# Patient Record
Sex: Female | Born: 1950 | Race: Black or African American | Hispanic: No | Marital: Married | Smoking: Never smoker
Health system: Southern US, Community
[De-identification: ages and names within clinical notes are randomized; demographics above are authoritative.]

## PROBLEM LIST (undated history)

## (undated) DIAGNOSIS — M1990 Unspecified osteoarthritis, unspecified site: Secondary | ICD-10-CM

## (undated) DIAGNOSIS — C801 Malignant (primary) neoplasm, unspecified: Secondary | ICD-10-CM

## (undated) DIAGNOSIS — R51 Headache: Secondary | ICD-10-CM

## (undated) DIAGNOSIS — J45909 Unspecified asthma, uncomplicated: Secondary | ICD-10-CM

## (undated) DIAGNOSIS — E669 Obesity, unspecified: Secondary | ICD-10-CM

## (undated) DIAGNOSIS — I10 Essential (primary) hypertension: Secondary | ICD-10-CM

## (undated) DIAGNOSIS — R0602 Shortness of breath: Secondary | ICD-10-CM

## (undated) HISTORY — PX: PARATHYROIDECTOMY: SHX19

## (undated) HISTORY — PX: ABDOMINAL HYSTERECTOMY: SHX81

## (undated) HISTORY — PX: ABDOMINAL MASS RESECTION: SHX1110

## (undated) HISTORY — DX: Obesity, unspecified: E66.9

## (undated) HISTORY — PX: BREAST SURGERY: SHX581

## (undated) HISTORY — PX: TONSILLECTOMY: SUR1361

## (undated) HISTORY — PX: LIPOMA EXCISION: SHX5283

---

## 2002-04-21 ENCOUNTER — Encounter: Admission: RE | Admit: 2002-04-21 | Discharge: 2002-04-21 | Payer: Self-pay | Admitting: *Deleted

## 2002-04-21 ENCOUNTER — Encounter: Payer: Self-pay | Admitting: *Deleted

## 2002-05-28 ENCOUNTER — Encounter (INDEPENDENT_AMBULATORY_CARE_PROVIDER_SITE_OTHER): Payer: Self-pay | Admitting: *Deleted

## 2002-05-28 ENCOUNTER — Ambulatory Visit (HOSPITAL_COMMUNITY): Admission: RE | Admit: 2002-05-28 | Discharge: 2002-05-28 | Payer: Self-pay | Admitting: *Deleted

## 2002-05-28 ENCOUNTER — Encounter: Payer: Self-pay | Admitting: *Deleted

## 2002-06-30 ENCOUNTER — Encounter: Payer: Self-pay | Admitting: General Surgery

## 2002-07-05 ENCOUNTER — Ambulatory Visit (HOSPITAL_COMMUNITY): Admission: RE | Admit: 2002-07-05 | Discharge: 2002-07-06 | Payer: Self-pay | Admitting: General Surgery

## 2002-07-05 ENCOUNTER — Encounter (INDEPENDENT_AMBULATORY_CARE_PROVIDER_SITE_OTHER): Payer: Self-pay | Admitting: *Deleted

## 2003-03-25 ENCOUNTER — Encounter: Payer: Self-pay | Admitting: Internal Medicine

## 2003-03-25 ENCOUNTER — Encounter: Admission: RE | Admit: 2003-03-25 | Discharge: 2003-03-25 | Payer: Self-pay | Admitting: Internal Medicine

## 2003-10-26 ENCOUNTER — Emergency Department (HOSPITAL_COMMUNITY): Admission: EM | Admit: 2003-10-26 | Discharge: 2003-10-26 | Payer: Self-pay | Admitting: Emergency Medicine

## 2003-11-14 ENCOUNTER — Encounter: Admission: RE | Admit: 2003-11-14 | Discharge: 2003-12-28 | Payer: Self-pay | Admitting: Internal Medicine

## 2003-12-23 ENCOUNTER — Encounter: Admission: RE | Admit: 2003-12-23 | Discharge: 2003-12-23 | Payer: Self-pay | Admitting: Internal Medicine

## 2005-07-22 HISTORY — PX: BREAST LUMPECTOMY: SHX2

## 2006-10-22 ENCOUNTER — Encounter: Admission: RE | Admit: 2006-10-22 | Discharge: 2006-10-22 | Payer: Self-pay | Admitting: Internal Medicine

## 2007-07-07 ENCOUNTER — Encounter: Admission: RE | Admit: 2007-07-07 | Discharge: 2007-07-07 | Payer: Self-pay | Admitting: Internal Medicine

## 2008-01-05 ENCOUNTER — Encounter: Admission: RE | Admit: 2008-01-05 | Discharge: 2008-01-05 | Payer: Self-pay | Admitting: Internal Medicine

## 2008-07-07 ENCOUNTER — Encounter: Admission: RE | Admit: 2008-07-07 | Discharge: 2008-07-07 | Payer: Self-pay | Admitting: Internal Medicine

## 2008-07-08 ENCOUNTER — Encounter: Admission: RE | Admit: 2008-07-08 | Discharge: 2008-07-08 | Payer: Self-pay | Admitting: Internal Medicine

## 2009-07-10 ENCOUNTER — Encounter: Admission: RE | Admit: 2009-07-10 | Discharge: 2009-07-10 | Payer: Self-pay | Admitting: Internal Medicine

## 2010-07-13 ENCOUNTER — Encounter
Admission: RE | Admit: 2010-07-13 | Discharge: 2010-07-13 | Payer: Self-pay | Source: Home / Self Care | Attending: Internal Medicine | Admitting: Internal Medicine

## 2010-12-07 NOTE — Op Note (Signed)
NAME:  Suzanne Pearson, Suzanne Pearson                   ACCOUNT NO.:  [id number]   MEDICAL RECORD NO.:  [id number]                   PATIENT TYPE:  OIB   LOCATION:  3029                                 FACILITY:  [facility]   PHYSICIAN:  Adolph Pollack, M.D.            DATE OF BIRTH:  [birth year]   DATE OF PROCEDURE:  07/05/2002  DATE OF DISCHARGE:                                 OPERATIVE REPORT   PREOPERATIVE DIAGNOSIS:  Left lower quadrant soft tissue mass.   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   PROCEDURE:  Wide local excision of left lower quadrant abdominal wall mass  with primary reconstruction of abdominal wall.   SURGEON:  Adolph Pollack, M.D.   ANESTHESIA:  General.   INDICATIONS:  This is a 60 year old female who has noticed a mass in the  left lower quadrant abdominal wall that has been growing.  CT scans and fine  needle aspirations have been done.  Stromal cells have been demonstrated.  The mass continues to grow and is becoming more painful, and now she  presents for excision.   FINDINGS:  The mass appeared to be emanated from the internal oblique or  transversalis muscle.  By frozen section diagnosis, this was a leiomyoma;  however, permanent diagnosis and counts of miotic fields are pending to  determine if he has sarcomatous features or not.   TECHNIQUE:  In the holding room, the mass was marked.  She was brought to  the operating room and placed supine on the operating table, and general  anesthetic was administered.  Her left lower quadrant area was sterilely  prepped and draped.  An incision was directly made in the left lower  quadrant area, just superior to where an inguinal hernia incision would be  made.  It was carried down through the subcutaneous tissue until a firm mass  could be palpated, again trying to give a rim of normal tissue around the  mass until it lead me down to the external oblique  aponeurosis which I had  opened up partially and thick muscle underneath the mass.  The mass appeared  to be emanating from the internal oblique or transversalis muscle, and I  took this out, exposing underlying peritoneum but not dilating the  peritoneum.  I sent the mass off for frozen section evaluation, and it was  called back as most consistent with leiomyoma at this time, although final  report is pending.   Next, I irrigated out the wound.  I then decided to close the wound  primarily as a small amount of external oblique aponeurosis had to be  removed.  I mobilized the subcutaneous tissue off of the external oblique  aponeurosis in all directions.  I then closed the external oblique  aponeurosis primarily with interrupted 0 Surgilon sutures.  There was  minimal tension on the area.  I anesthetized it with 0.5% Marcaine.  I then  irrigated out the wound and closed the subcutaneous tissue with a running 3-  0 Vicryl suture.  The skin was closed with a 4-0 Monocryl subcuticular  stitch.  Steri-Strips and sterile dressings were applied.   She tolerated the procedure well without any apparent complications.  She  was taken to the recovery room in satisfactory condition.                                               Adolph Pollack, M.D.    Kari Baars  D:  07/05/2002  T:  07/05/2002  Job:  [id number]   cc:   Gerri Spore Pearson. Earlene Plater, M.D.  [address]  [place]  [place] [address]  Fax: [phone]

## 2011-06-21 ENCOUNTER — Other Ambulatory Visit: Payer: Self-pay | Admitting: Internal Medicine

## 2011-06-21 DIAGNOSIS — Z9889 Other specified postprocedural states: Secondary | ICD-10-CM

## 2011-06-21 DIAGNOSIS — Z853 Personal history of malignant neoplasm of breast: Secondary | ICD-10-CM

## 2011-06-23 ENCOUNTER — Emergency Department (HOSPITAL_COMMUNITY)
Admission: EM | Admit: 2011-06-23 | Discharge: 2011-06-23 | Disposition: A | Discharge status: Home / Self Care | Payer: BC Managed Care – PPO | Source: Home / Self Care

## 2011-06-23 ENCOUNTER — Emergency Department (INDEPENDENT_AMBULATORY_CARE_PROVIDER_SITE_OTHER): Payer: BC Managed Care – PPO

## 2011-06-23 ENCOUNTER — Encounter: Payer: Self-pay | Admitting: *Deleted

## 2011-06-23 DIAGNOSIS — S93601A Unspecified sprain of right foot, initial encounter: Secondary | ICD-10-CM

## 2011-06-23 DIAGNOSIS — S93609A Unspecified sprain of unspecified foot, initial encounter: Secondary | ICD-10-CM

## 2011-06-23 HISTORY — DX: Essential (primary) hypertension: I10

## 2011-06-23 MED ORDER — TRAMADOL HCL 50 MG PO TABS: 50.0000 mg | ORAL_TABLET | Freq: Four times a day (QID) | ORAL | Status: AC | PRN

## 2011-06-23 MED ORDER — DICLOFENAC SODIUM 75 MG PO TBEC: 75.0000 mg | DELAYED_RELEASE_TABLET | Freq: Two times a day (BID) | ORAL | Status: AC

## 2011-06-23 NOTE — ED Notes (Signed)
C/O right dorsal foot pain since yesterday evening; had slipped that morning on slippery floor.  Has been applying pain patches and tiger balm without relief.  Pain progressively worsening.

## 2011-06-23 NOTE — ED Provider Notes (Signed)
History     CSN: [id number] Arrival date & time: 06/23/2011  6:38 PM   None     No chief complaint on file.   (Consider location/radiation/quality/duration/timing/severity/associated sxs/prior treatment) HPI Comments: Rt foot pain. "I slid across the bathroom floor yesterday. I didn't fall."  Later in the day when she took off her boot noticed pain with walking and weight bearing. Using an over the counter pain patch without relief.   Patient is a 60 y.o. female presenting with lower extremity pain. The history is provided by the patient.  Foot Pain This is a new problem. The current episode started yesterday. The problem occurs constantly. The problem has been gradually worsening. The symptoms are aggravated by walking. The symptoms are relieved by nothing.    No past medical history on file.  No past surgical history on file.  No family history on file.  History  Substance Use Topics  . Smoking status: Not on file  . Smokeless tobacco: Not on file  . Alcohol Use: Not on file    OB History    No data available      Review of Systems  Musculoskeletal: Negative for joint swelling.  Skin: Negative for color change, rash and wound.    Allergies  Review of patient's allergies indicates not on file.  Home Medications  No current outpatient prescriptions on file.  BP 158/84  Pulse 70  Temp(Src) 98.3 F (36.8 C) (Oral)  Resp 17  SpO2 98%  Physical Exam  Nursing note and vitals reviewed. Constitutional: She appears well-developed and well-nourished. No distress.  Pulmonary/Chest: Effort normal and breath sounds normal. No respiratory distress.  Musculoskeletal:       Right ankle: Normal. She exhibits normal range of motion, no swelling, no ecchymosis and no deformity. no tenderness. Achilles tendon normal.       Right foot: She exhibits tenderness and bony tenderness. She exhibits normal range of motion, no swelling, normal capillary refill, no deformity and no  laceration.       Feet:  Skin: Skin is warm and dry.  Psychiatric: She has a normal mood and affect.    ED Course  Procedures (including critical care time)  Labs Reviewed - No data to display No results found.   No diagnosis found.    MDM  Xray reviewed by radiologist and myself.         Melody Comas, [place] 06/23/11 2002

## 2011-06-24 NOTE — ED Provider Notes (Signed)
Medical screening examination/treatment/procedure(s) were performed by non-physician practitioner and as supervising physician I was immediately available for consultation/collaboration.  LANEY,RONNIE   Ronnie Laney, MD 06/24/11 1658 

## 2011-07-17 ENCOUNTER — Ambulatory Visit
Admission: RE | Admit: 2011-07-17 | Discharge: 2011-07-17 | Disposition: A | Discharge status: Home / Self Care | Payer: BC Managed Care – PPO | Source: Ambulatory Visit | Attending: Internal Medicine | Admitting: Internal Medicine

## 2011-07-17 DIAGNOSIS — Z853 Personal history of malignant neoplasm of breast: Secondary | ICD-10-CM

## 2011-07-17 DIAGNOSIS — Z9889 Other specified postprocedural states: Secondary | ICD-10-CM

## 2012-06-12 ENCOUNTER — Other Ambulatory Visit: Payer: Self-pay | Admitting: Internal Medicine

## 2012-06-12 DIAGNOSIS — R1031 Right lower quadrant pain: Secondary | ICD-10-CM

## 2012-06-15 ENCOUNTER — Ambulatory Visit
Admission: RE | Admit: 2012-06-15 | Discharge: 2012-06-15 | Disposition: A | Discharge status: Home / Self Care | Payer: BC Managed Care – PPO | Source: Ambulatory Visit | Attending: Internal Medicine | Admitting: Internal Medicine

## 2012-06-15 DIAGNOSIS — R1031 Right lower quadrant pain: Secondary | ICD-10-CM

## 2012-07-02 ENCOUNTER — Other Ambulatory Visit: Payer: Self-pay | Admitting: Gastroenterology

## 2012-07-02 ENCOUNTER — Other Ambulatory Visit: Payer: Self-pay | Admitting: Internal Medicine

## 2012-07-02 DIAGNOSIS — Z853 Personal history of malignant neoplasm of breast: Secondary | ICD-10-CM

## 2012-07-13 ENCOUNTER — Encounter (HOSPITAL_COMMUNITY)
Admission: RE | Disposition: A | Discharge status: Home / Self Care | Payer: Self-pay | Source: Ambulatory Visit | Attending: Gastroenterology

## 2012-07-13 ENCOUNTER — Ambulatory Visit (HOSPITAL_COMMUNITY)
Admission: RE | Admit: 2012-07-13 | Discharge: 2012-07-13 | Disposition: A | Discharge status: Home / Self Care | Payer: BC Managed Care – PPO | Source: Ambulatory Visit | Attending: Gastroenterology | Admitting: Gastroenterology

## 2012-07-13 ENCOUNTER — Encounter (HOSPITAL_COMMUNITY): Payer: Self-pay | Admitting: *Deleted

## 2012-07-13 DIAGNOSIS — I10 Essential (primary) hypertension: Secondary | ICD-10-CM | POA: Insufficient documentation

## 2012-07-13 DIAGNOSIS — R1031 Right lower quadrant pain: Secondary | ICD-10-CM | POA: Insufficient documentation

## 2012-07-13 DIAGNOSIS — Z90710 Acquired absence of both cervix and uterus: Secondary | ICD-10-CM | POA: Insufficient documentation

## 2012-07-13 DIAGNOSIS — Z853 Personal history of malignant neoplasm of breast: Secondary | ICD-10-CM | POA: Insufficient documentation

## 2012-07-13 DIAGNOSIS — Z860100 Personal history of colon polyps, unspecified: Secondary | ICD-10-CM | POA: Insufficient documentation

## 2012-07-13 HISTORY — DX: Malignant (primary) neoplasm, unspecified: C80.1

## 2012-07-13 HISTORY — DX: Unspecified asthma, uncomplicated: J45.909

## 2012-07-13 HISTORY — DX: Shortness of breath: R06.02

## 2012-07-13 HISTORY — PX: COLONOSCOPY: SHX5424

## 2012-07-13 HISTORY — DX: Headache: R51

## 2012-07-13 SURGERY — COLONOSCOPY
Anesthesia: Moderate Sedation

## 2012-07-13 MED ORDER — FENTANYL CITRATE 0.05 MG/ML IJ SOLN
INTRAMUSCULAR | Status: AC
  Filled 2012-07-13: qty 2

## 2012-07-13 MED ORDER — MIDAZOLAM HCL 10 MG/2ML IJ SOLN
INTRAMUSCULAR | Status: AC
  Filled 2012-07-13: qty 2

## 2012-07-13 MED ORDER — SODIUM CHLORIDE 0.9 % IV SOLN
INTRAVENOUS | Status: DC
  Administered 2012-07-13: 500 mL via INTRAVENOUS

## 2012-07-13 MED ORDER — FENTANYL CITRATE 0.05 MG/ML IJ SOLN
INTRAMUSCULAR | Status: DC | PRN
  Administered 2012-07-13: 50 ug via INTRAVENOUS
  Administered 2012-07-13: 25 ug via INTRAVENOUS

## 2012-07-13 MED ORDER — MIDAZOLAM HCL 5 MG/5ML IJ SOLN
INTRAMUSCULAR | Status: DC | PRN
  Administered 2012-07-13 (×3): 2.5 mg via INTRAVENOUS

## 2012-07-13 NOTE — Op Note (Signed)
Procedure: Colonoscopy.  Indication: History of colon polyps. Unexplained right lower quadrant abdominal pain.  Endoscopist: Danise Edge  Premedication: Fentanyl 75 mcg intravenously. Versed 7.5 mg intravenously.  Procedure: The patient was placed in the left lateral decubitus position. Anal inspection and digital rectal exam were normal. The Pentax pediatric colonoscope was introduced into the rectum and easily advanced to the cecum. A normal-appearing ileocecal valve was intubated and the distal ileum inspected. Colonic preparation for the exam today was good.  Rectum. Normal. Retroflex view of the distal rectum normal.  Sigmoid colon and descending colon. Normal.  Splenic flexure. Normal.  Transverse colon. Normal.  Hepatic flexure. Normal.  Ascending colon. Normal.  Cecum and ileocecal valve. Normal.  Distal ileum. Normal.  Assessment: Normal surveillance proctocolonoscopy to the cecum with intubation of a normal-appearing ileocecal valve and inspection of the terminal ileum.  Recommendations: Schedule surveillance colonoscopy in 5 years.

## 2012-07-13 NOTE — H&P (Signed)
  Problem: History of colon polyps. Unexplained right lower quadrant abdominal pain.  History: The patient is a 61 year old female born May 15, 1951.  The patient has a history of colon polyps. She underwent a normal surveillance colonoscopy in December 2008. She is scheduled today to undergo a repeat surveillance colonoscopy.  Patient has unexplained right lower quadrant abdominal pain. She has undergone a hysterectomy but her ovaries remain intact.  In November 2013, she underwent a CT scan of the abdomen and pelvis which showed a small hypodense lesion in the dome of the liver suspicious for cyst, a fatty tumor in the kidney, and a suspected left adrenal adenoma. The left ovary was visualized but the right ovary could not be visualized.  Medication allergies: Penicillin. Sulfa. Iodine. Latex gloves. Topamax. Neurontin. ACE inhibitors.  Chronic medications: Acetaminophen. Codeine. Vitamin D. Calcium. Fish oil. Diovan.  Past medical and surgical history: Hypertension. Migraine headache syndrome. Cervical degenerative joint disease. Allergic rhinitis. Breast cancer. History of colon polyps. Parathyroid surgery as a child for hyperparathyroidism. Total abdominal hysterectomy. Breast lumpectomy followed by radiation therapy.  Exam: The patient is alert and lying comfortably on the endoscopy stretcher. Lungs are clear the auscultation. Cardiac exam reveals a regular rhythm without audible murmurs. Abdomen is soft, flat, and nontender to palpation in all quadrants.  Plan: Proceed with surveillance colonoscopy with polypectomy to prevent colon cancer and diagnostic colonoscopy to evaluate unexplained right lower quadrant abdominal pain.

## 2012-07-14 ENCOUNTER — Encounter (HOSPITAL_COMMUNITY): Payer: Self-pay | Admitting: Gastroenterology

## 2012-07-29 ENCOUNTER — Other Ambulatory Visit: Payer: Self-pay | Admitting: Obstetrics

## 2012-07-29 ENCOUNTER — Ambulatory Visit
Admission: RE | Admit: 2012-07-29 | Discharge: 2012-07-29 | Disposition: A | Discharge status: Home / Self Care | Payer: BC Managed Care – PPO | Source: Ambulatory Visit | Attending: Internal Medicine | Admitting: Internal Medicine

## 2012-07-29 DIAGNOSIS — Z853 Personal history of malignant neoplasm of breast: Secondary | ICD-10-CM

## 2012-07-29 DIAGNOSIS — E213 Hyperparathyroidism, unspecified: Secondary | ICD-10-CM

## 2012-08-03 ENCOUNTER — Ambulatory Visit
Admission: RE | Admit: 2012-08-03 | Discharge: 2012-08-03 | Disposition: A | Discharge status: Home / Self Care | Payer: BC Managed Care – PPO | Source: Ambulatory Visit | Attending: Obstetrics | Admitting: Obstetrics

## 2012-08-03 DIAGNOSIS — E213 Hyperparathyroidism, unspecified: Secondary | ICD-10-CM

## 2013-01-20 ENCOUNTER — Other Ambulatory Visit (HOSPITAL_COMMUNITY): Payer: Self-pay | Admitting: Internal Medicine

## 2013-01-20 DIAGNOSIS — R102 Pelvic and perineal pain: Secondary | ICD-10-CM

## 2013-01-20 DIAGNOSIS — R1031 Right lower quadrant pain: Secondary | ICD-10-CM

## 2013-01-29 ENCOUNTER — Encounter (HOSPITAL_COMMUNITY)
Admission: RE | Admit: 2013-01-29 | Discharge: 2013-01-29 | Disposition: A | Discharge status: Home / Self Care | Payer: BC Managed Care – PPO | Source: Ambulatory Visit | Attending: Internal Medicine | Admitting: Internal Medicine

## 2013-01-29 DIAGNOSIS — Z853 Personal history of malignant neoplasm of breast: Secondary | ICD-10-CM | POA: Insufficient documentation

## 2013-01-29 DIAGNOSIS — R109 Unspecified abdominal pain: Secondary | ICD-10-CM | POA: Insufficient documentation

## 2013-01-29 DIAGNOSIS — R1031 Right lower quadrant pain: Secondary | ICD-10-CM

## 2013-01-29 DIAGNOSIS — M542 Cervicalgia: Secondary | ICD-10-CM | POA: Insufficient documentation

## 2013-01-29 DIAGNOSIS — M25519 Pain in unspecified shoulder: Secondary | ICD-10-CM | POA: Insufficient documentation

## 2013-01-29 DIAGNOSIS — R102 Pelvic and perineal pain: Secondary | ICD-10-CM

## 2013-01-29 MED ORDER — TECHNETIUM TC 99M MEDRONATE IV KIT
25.0000 | PACK | Freq: Once | INTRAVENOUS | Status: AC | PRN
  Administered 2013-01-29: 25 via INTRAVENOUS

## 2013-02-02 ENCOUNTER — Encounter (INDEPENDENT_AMBULATORY_CARE_PROVIDER_SITE_OTHER): Payer: Self-pay | Admitting: General Surgery

## 2013-02-03 ENCOUNTER — Encounter (INDEPENDENT_AMBULATORY_CARE_PROVIDER_SITE_OTHER): Payer: Self-pay

## 2013-02-15 ENCOUNTER — Ambulatory Visit (INDEPENDENT_AMBULATORY_CARE_PROVIDER_SITE_OTHER): Payer: BC Managed Care – PPO | Admitting: Surgery

## 2013-02-22 ENCOUNTER — Encounter: Payer: Self-pay | Admitting: Dietician

## 2013-02-22 ENCOUNTER — Encounter: Payer: BC Managed Care – PPO | Attending: Internal Medicine | Admitting: Dietician

## 2013-02-22 VITALS — Ht 65.0 in | Wt 215.0 lb

## 2013-02-22 DIAGNOSIS — E669 Obesity, unspecified: Secondary | ICD-10-CM | POA: Insufficient documentation

## 2013-02-22 DIAGNOSIS — Z713 Dietary counseling and surveillance: Secondary | ICD-10-CM | POA: Insufficient documentation

## 2013-02-22 NOTE — Patient Instructions (Addendum)
Eat 3 meals per day especially breakfast.  Add snacks every 3-5 hours you are awake. Make half of your plate vegetables. Keeping walking 3 x week, increasing your duration over time. Do exercises in the pool on the weekend when husband is home.

## 2013-02-22 NOTE — Progress Notes (Signed)
  Medical Nutrition Therapy:  Appt start time: 0900 end time:  1000.   Assessment:  Primary concerns today: Decided she would like to talk to dietitian in order to lose weight. Doctor recommended she lose 15 pounds by January. Suzanne Pearson describes herself as "thin" most of her life with some fluctuations.  Overall she gained weight gradually over time, though the  weight gain was more rapid since she retired 4 years ago. Highest weight was 219 pounds a few weeks ago. Has "tremendous" pain in side, neck, back of unknown origin and is seeking treatment from an acupuncturist which has been helping. Her pain started in October and has made sleeping and physical activity difficult.    Lives at home with husband who works in [place] during the week and comes home on the weekends. 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   Currently  Walking 3 x week on treadmill for past month. Will skip one meal per day most days. Has pool at home but will only go in the pool when husband is home.   MEDICATIONS: See List   DIETARY INTAKE:  Avoided foods include processed meat, juice, soda  24-hr recall:  B ( AM): cereal with almond milk or fruit or egg and cheese sometimes with ginger tea Snk ( AM): none  L ( PM): salad with chicken break, nuts, croutons or vegetable or tuna or [place] sandwich with water sometimes with flavor packets, green tea or peach tea Snk ( PM): none D ( PM): seafood and vegetable, chicken with water usually  Snk ( PM): nuts, chips  Beverages: mostly water sometimes tea, rarely alcohol  Usual physical activity: 3 x weeks on treadmill 20 minutes, sit ups  Estimated energy needs: 1800 calories 200 g carbohydrates 135 g protein 50 g fat  Progress Towards Goal(s):  In progress.   Nutritional Diagnosis:  NB-1.1 Food and nutrition-related knowledge deficit As related to meal skipping .  As evidenced by diet recall.    Intervention:  Nutrition counseling provided.  Discussed the importance of eating three meals a day and making sure she is eating every 3-5 hours she is awake. Discussed adding snacks with protein as needed and continuing with physical activity such as walking and water exercises when possible.    Plan: Eat 3 meals per day especially breakfast.  Add snacks every 3-5 hours you are awake. Make half of your plate vegetables. Keeping walking 3 x week, increasing your duration over time. Do exercises in the pool on the weekend when husband is home.   Handouts given during visit include:  MyPlate handout  Weight loss tips  15 g CHO snacks  Monitoring/Evaluation:  Dietary intake, exercise, and body weight in 4 week(s).

## 2013-03-15 DIAGNOSIS — E210 Primary hyperparathyroidism: Secondary | ICD-10-CM | POA: Insufficient documentation

## 2013-03-23 ENCOUNTER — Encounter: Payer: BC Managed Care – PPO | Attending: Internal Medicine | Admitting: Dietician

## 2013-03-23 VITALS — Ht 65.0 in | Wt 216.1 lb

## 2013-03-23 DIAGNOSIS — E669 Obesity, unspecified: Secondary | ICD-10-CM

## 2013-03-23 DIAGNOSIS — Z713 Dietary counseling and surveillance: Secondary | ICD-10-CM | POA: Insufficient documentation

## 2013-03-23 NOTE — Progress Notes (Signed)
  Medical Nutrition Therapy:  Appt start time: 0900 end time:  1000.   Assessment:  Suzanne Pearson returns today for a follow-up on weight loss. Gained one pound since last visit. Currently eating 2 meals per day and 2 snacks. Increasing physical activity more than before but limited by pain in shoulder and leg. Scheduled for surgery on parathyroid on 10/1 which she hopes will improve joint pain. States she is not sleeping well since her diuretic gets her up every two hours overnight.   Wt Readings from Last 3 Encounters:  03/23/13 216 lb 1.6 oz (98.022 kg)  02/22/13 215 lb (97.523 kg)  07/13/12 209 lb (94.802 kg)   Ht Readings from Last 3 Encounters:  03/23/13 5\' 5"  (1.651 m)  02/22/13 5\' 5"  (1.651 m)  07/13/12 5\' 4"  (1.626 m)   Body mass index is 35.96 kg/(m^2). @BMIFA @ Normalized weight-for-age data available only for age 73 to 20 years. Normalized stature-for-age data available only for age 73 to 20 years.   MEDICATIONS: See List   DIETARY INTAKE:  Avoided foods include processed meat, juice, soda  24-hr recall:  B ( AM): cereal with almond milk or fruit or egg and cheese sometimes with ginger tea Snk ( AM): none  L ( PM): salad with chicken, nuts, croutons or vegetable or tuna or [place] sandwich with water sometimes with flavor packets, green tea or peach tea Snk ( PM): none D ( PM): seafood and vegetable, chicken with water usually  Snk ( PM): nuts, sugar-free pudding   Beverages: mostly water sometimes tea, rarely alcohol  Usual physical activity: 3 x weeks on treadmill 20 minutes, sit ups  Estimated energy needs: 1800 calories 200 g carbohydrates 135 g protein 50 g fat  Progress Towards Goal(s):  In progress.   Nutritional Diagnosis:  NB-1.1 Food and nutrition-related knowledge deficit As related to meal skipping .  As evidenced by diet recall.    Intervention:  Nutrition counseling provided. Recommend that Suzanne Pearson continue eating frequent meals and snacks and exercise as best  she can with her pain. Will have a follow up after her surgery to see if she has more energy to focus on weight loss. Plan: Eat 3 meals per day especially breakfast.  Continue having snacks every 3-5 hours you are awake. Continue making half of your plate vegetables. Keeping walking 3 x week, increasing your duration over time.    Monitoring/Evaluation:  Dietary intake, exercise, and body weight in 8 week(s).

## 2013-03-23 NOTE — Patient Instructions (Addendum)
Eat 3 meals per day especially breakfast.  Continue having snacks every 3-5 hours you are awake. Continue making half of your plate vegetables. Keeping walking 3 x week, increasing your duration over time.

## 2013-05-18 ENCOUNTER — Ambulatory Visit: Payer: BC Managed Care – PPO | Admitting: Dietician

## 2013-06-15 ENCOUNTER — Encounter: Payer: BC Managed Care – PPO | Attending: Internal Medicine | Admitting: Dietician

## 2013-06-15 VITALS — Ht 65.0 in | Wt 214.5 lb

## 2013-06-15 DIAGNOSIS — E669 Obesity, unspecified: Secondary | ICD-10-CM | POA: Insufficient documentation

## 2013-06-15 DIAGNOSIS — Z713 Dietary counseling and surveillance: Secondary | ICD-10-CM | POA: Insufficient documentation

## 2013-06-15 NOTE — Progress Notes (Signed)
  Medical Nutrition Therapy:  Appt start time: 1230 end time:  100.  Assessment:  Suzanne Pearson returns today for a follow-up on weight loss. Lost 2 pounds since last visit 7 weeks post-op from surgery to remove parathyroid glands. Feeling tired and still has joint point, but energy level is improving. Sleeping better now, only getting up one time at night.   Wt Readings from Last 3 Encounters:  06/15/13 214 lb 8 oz (97.297 kg)  03/23/13 216 lb 1.6 oz (98.022 kg)  02/22/13 215 lb (97.523 kg)   Ht Readings from Last 3 Encounters:  06/15/13 5\' 5"  (1.651 m)  03/23/13 5\' 5"  (1.651 m)  02/22/13 5\' 5"  (1.651 m)   Body mass index is 35.69 kg/(m^2). @BMIFA @ Normalized weight-for-age data available only for age 44 to 20 years. Normalized stature-for-age data available only for age 44 to 20 years.   MEDICATIONS: See List   DIETARY INTAKE:  Avoided foods include processed meat, juice, soda  24-hr recall:  B ( AM): cereal with almond milk or fruit or egg and cheese sometimes with ginger tea Snk ( AM): none  L ( PM): salad with chicken, nuts, croutons or vegetable or tuna or [place] sandwich with water sometimes with flavor packets, green tea or peach tea Snk ( PM): none D ( PM): seafood and vegetable, chicken with water usually  Snk ( PM): nuts, sugar-free pudding, popcorn, cheesesticks   Beverages: mostly water sometimes tea, rarely alcohol  Usual physical activity: not cleared yet to exercise, walking up and down the steps, doing some walking  Estimated energy needs: 1800 calories 200 g carbohydrates 135 g protein 50 g fat  Progress Towards Goal(s):  In progress.   Nutritional Diagnosis:  NB-1.1 Food and nutrition-related knowledge deficit As related to meal skipping .  As evidenced by diet recall.    Intervention:  Nutrition counseling provided. Recommend that Suzanne Pearson continue eating frequent meals and snacks and try to walk for 10 minutes per day.  Plan: Eat 3 meals per day especially  breakfast.  Continue having snacks every 3-5 hours you are awake. Continue making half of your plate vegetables. Start walking 10-15 minutes per day.    Monitoring/Evaluation:  Dietary intake, exercise, and body weight in 8 week(s).

## 2013-06-15 NOTE — Patient Instructions (Addendum)
Eat 3 meals per day especially breakfast.  Continue having snacks every 3-5 hours you are awake. Continue making half of your plate vegetables. Start walking 10-15 minutes per day.

## 2013-06-30 ENCOUNTER — Other Ambulatory Visit: Payer: Self-pay | Admitting: Internal Medicine

## 2013-06-30 DIAGNOSIS — Z853 Personal history of malignant neoplasm of breast: Secondary | ICD-10-CM

## 2013-06-30 DIAGNOSIS — Z9889 Other specified postprocedural states: Secondary | ICD-10-CM

## 2013-07-30 ENCOUNTER — Ambulatory Visit
Admission: RE | Admit: 2013-07-30 | Discharge: 2013-07-30 | Disposition: A | Discharge status: Home / Self Care | Payer: BC Managed Care – PPO | Source: Ambulatory Visit | Attending: Internal Medicine | Admitting: Internal Medicine

## 2013-07-30 DIAGNOSIS — Z9889 Other specified postprocedural states: Secondary | ICD-10-CM

## 2013-07-30 DIAGNOSIS — Z853 Personal history of malignant neoplasm of breast: Secondary | ICD-10-CM

## 2013-08-11 ENCOUNTER — Ambulatory Visit
Admission: RE | Admit: 2013-08-11 | Discharge: 2013-08-11 | Disposition: A | Discharge status: Home / Self Care | Payer: BC Managed Care – PPO | Source: Ambulatory Visit | Attending: Internal Medicine | Admitting: Internal Medicine

## 2013-08-11 ENCOUNTER — Other Ambulatory Visit: Payer: Self-pay | Admitting: Internal Medicine

## 2013-08-11 ENCOUNTER — Other Ambulatory Visit: Payer: Self-pay | Admitting: Physician Assistant

## 2013-08-11 DIAGNOSIS — R0781 Pleurodynia: Secondary | ICD-10-CM

## 2013-08-16 ENCOUNTER — Encounter: Payer: BC Managed Care – PPO | Attending: Internal Medicine | Admitting: Dietician

## 2013-08-16 VITALS — Ht 65.0 in | Wt 218.6 lb

## 2013-08-16 DIAGNOSIS — E669 Obesity, unspecified: Secondary | ICD-10-CM

## 2013-08-16 DIAGNOSIS — Z713 Dietary counseling and surveillance: Secondary | ICD-10-CM | POA: Insufficient documentation

## 2013-08-16 NOTE — Progress Notes (Signed)
  Medical Nutrition Therapy:  Appt start time: 945 end time:  1000.  Assessment:  Suzanne Pearson returns today for a follow-up on weight loss. Gained 4 pounds since last visit. Feeling a lot better than before, less joint and shoulder pain and energy is better. Sleeping better than before. Having some abdominal muscle spasms from time to time relieved with Flexeril.    Wt Readings from Last 3 Encounters:  08/16/13 218 lb 9.6 oz (99.156 kg)  06/15/13 214 lb 8 oz (97.297 kg)  03/23/13 216 lb 1.6 oz (98.022 kg)   Ht Readings from Last 3 Encounters:  08/16/13 5\' 5"  (1.651 m)  06/15/13 5\' 5"  (1.651 m)  03/23/13 5\' 5"  (1.651 m)   Body mass index is 36.38 kg/(m^2). @BMIFA @ Normalized weight-for-age data available only for age 49 to 77 years. Normalized stature-for-age data available only for age 49 to 44 years.   MEDICATIONS: See List   DIETARY INTAKE:  Avoided foods include processed meat, juice, soda  24-hr recall:  B ( AM): cereal with almond milk or fruit or egg and cheese sometimes or yogurt with ginger tea Snk ( AM): none  L ( PM): salad with chicken, nuts, croutons or vegetable or tuna or [place] sandwich with water sometimes with flavor packets, green tea or peach tea Snk ( PM): none D ( PM): seafood and vegetable, chicken with water usually  Snk ( PM): nuts, sugar-free pudding, popcorn,  Beverages: mostly water sometimes tea, rarely alcohol  Usual physical activity: walking 3 x week on treadmill for 15 minutes  Estimated energy needs: 1800 calories 200 g carbohydrates 135 g protein 50 g fat  Progress Towards Goal(s):  In progress.   Nutritional Diagnosis:  NB-1.1 Food and nutrition-related knowledge deficit As related to meal skipping .  As evidenced by diet recall.    Intervention:  Nutrition counseling provided. Recommend that Suzanne Pearson continue eating frequent meals and snacks and aim to increase her exercise/activity level.  Plan: Eat 3 meals per day especially breakfast.   Continue having snacks every 3-5 hours you are awake. Continue making half of your plate vegetables. Work on increasing physical activity as tolerated.   Monitoring/Evaluation:  Dietary intake, exercise, and body weight prn.

## 2013-08-16 NOTE — Patient Instructions (Addendum)
Eat 3 meals per day especially breakfast.  Continue having snacks every 3-5 hours you are awake. Continue making half of your plate vegetables. Work on increasing physical activity as tolerated.

## 2013-11-11 DIAGNOSIS — E892 Postprocedural hypoparathyroidism: Secondary | ICD-10-CM | POA: Insufficient documentation

## 2013-11-11 DIAGNOSIS — Z9889 Other specified postprocedural states: Secondary | ICD-10-CM | POA: Insufficient documentation

## 2013-12-15 ENCOUNTER — Ambulatory Visit
Admission: RE | Admit: 2013-12-15 | Discharge: 2013-12-15 | Disposition: A | Discharge status: Home / Self Care | Payer: BC Managed Care – PPO | Source: Ambulatory Visit | Attending: Nurse Practitioner | Admitting: Nurse Practitioner

## 2013-12-15 ENCOUNTER — Other Ambulatory Visit: Payer: Self-pay | Admitting: Nurse Practitioner

## 2013-12-15 DIAGNOSIS — M79609 Pain in unspecified limb: Secondary | ICD-10-CM

## 2014-02-22 ENCOUNTER — Telehealth (INDEPENDENT_AMBULATORY_CARE_PROVIDER_SITE_OTHER): Payer: Self-pay | Admitting: *Deleted

## 2014-02-22 ENCOUNTER — Ambulatory Visit (INDEPENDENT_AMBULATORY_CARE_PROVIDER_SITE_OTHER): Payer: BC Managed Care – PPO | Admitting: General Surgery

## 2014-02-22 ENCOUNTER — Encounter (INDEPENDENT_AMBULATORY_CARE_PROVIDER_SITE_OTHER): Payer: Self-pay | Admitting: General Surgery

## 2014-02-22 VITALS — BP 120/74 | HR 81 | Temp 97.3°F | Ht 64.0 in | Wt 224.0 lb

## 2014-02-22 DIAGNOSIS — R229 Localized swelling, mass and lump, unspecified: Secondary | ICD-10-CM

## 2014-02-22 DIAGNOSIS — R222 Localized swelling, mass and lump, trunk: Secondary | ICD-10-CM | POA: Insufficient documentation

## 2014-02-22 NOTE — Progress Notes (Signed)
Patient ID: Suzanne Pearson, female   DOB: [birth year], 63 y.o.   MRN: [id number]  Chief Complaint  Patient presents with  . eval lipoma on back    HPI Suzanne Pearson is Suzanne 63 y.o. female.  We are asked to see the patient in consultation by Dr. Lysle Rubens to evaluate her for Suzanne mass on her back. The patient is Suzanne 63 year old black female who recently found Suzanne mass on her back when she was scratching her back. She is not sure how long it has been there. She denies any pain associated with it. She has no radiating pain down her legs. She has otherwise been in good health.  HPI  Past Medical History  Diagnosis Date  . Hypertension   . Asthma     exercise induced  . Shortness of breath     on exertion  . Headache(784.0)     migraines  . Cancer     left breast  . Obesity     Past Surgical History  Procedure Laterality Date  . Abdominal hysterectomy    . Parathyroidectomy    . Breast surgery      lumpectomy  . Abdominal mass resection    . Tonsillectomy    . Colonoscopy  07/13/2012    Procedure: COLONOSCOPY;  Surgeon: Garlan Fair, MD;  Location: [facility];  Service: Endoscopy;  Laterality: N/Suzanne;    Family History  Problem Relation Age of Onset  . Hypertension Mother   . Hyperlipidemia Mother   . Diabetes Father   . Diabetes Sister   . Diabetes Brother   . Asthma Maternal Grandmother   . Heart attack Maternal Grandfather   . Asthma Paternal Grandmother   . Hypertension Paternal Grandmother   . Diabetes Paternal Grandmother   . Asthma Paternal Grandfather   . Hypertension Paternal Grandfather     Social History History  Substance Use Topics  . Smoking status: Never Smoker   . Smokeless tobacco: Not on file  . Alcohol Use: Yes    Allergies  Allergen Reactions  . Cranberry   . Iodine   . Latex   . Penicillins   . Pineapple   . Shrimp [Shellfish Allergy] Hives  . Sulfa Antibiotics   . Tomato     Current Outpatient Prescriptions  Medication Sig Dispense  Refill  . butalbital-acetaminophen-caffeine (FIORICET WITH CODEINE) 50-325-40-30 MG per capsule Take 1 capsule by mouth every 4 (four) hours as needed for headache.      . Omega-3 Fatty Acids (FISH OIL) 1000 MG CAPS Take by mouth.      . Sennosides (SENNA LAX PO) Take by mouth.      . Valsartan (DIOVAN PO) Take by mouth.        . valsartan-hydrochlorothiazide (DIOVAN-HCT) 160-12.5 MG per tablet Take by mouth.      . vitamin B-12 (CYANOCOBALAMIN) 1000 MCG tablet Take 1,000 mcg by mouth daily.       No current facility-administered medications for this visit.    Review of Systems Review of Systems  Constitutional: Negative.   HENT: Negative.   Eyes: Negative.   Respiratory: Negative.   Cardiovascular: Negative.   Gastrointestinal: Negative.   Endocrine: Negative.   Genitourinary: Negative.   Musculoskeletal: Negative.   Skin: Negative.   Allergic/Immunologic: Negative.   Neurological: Negative.   Hematological: Negative.   Psychiatric/Behavioral: Negative.     Blood pressure 120/74, pulse 81, temperature 97.3 F (36.3 C), height 5\' 4"  (1.626 m), weight 224 lb (101.606 kg).  Physical Exam Physical Exam  Constitutional: She is oriented to person, place, and time. She appears well-developed and well-nourished.  HENT:  Head: Normocephalic and atraumatic.  Eyes: Conjunctivae and EOM are normal. Pupils are equal, round, and reactive to light.  Neck: Normal range of motion. Neck supple.  Cardiovascular: Normal rate, regular rhythm and normal heart sounds.   Pulmonary/Chest: Effort normal and breath sounds normal.  Abdominal: Soft. Bowel sounds are normal.  Musculoskeletal: Normal range of motion.  There is Suzanne palpable mass that is approximately 4 cm in diameter just to the right of midline on her low back. It is not mobile and does not feel as if it's in the subcutaneous tissue. It feels like it may be arising deeper in the muscle. There is no tenderness to palpation.   Lymphadenopathy:    She has no cervical adenopathy.  Neurological: She is alert and oriented to person, place, and time.  Skin: Skin is warm and dry.  Psychiatric: She has Suzanne normal mood and affect. Her behavior is normal.    Data Reviewed As above  Assessment    The patient has an indeterminate mass in her low back that feels as though it's arising deep in the muscle. Because of this I think it would be reasonable to evaluate this mass radiographically before thinking about surgery.     Plan    I will plan to obtain Suzanne CT scan of her abdomen and pelvis to evaluate this mass and then we can proceed accordingly.        TOTH III,Jasiya Markie S 02/22/2014, 11:42 AM

## 2014-02-22 NOTE — Patient Instructions (Signed)
Will call with results of CT 

## 2014-02-22 NOTE — Telephone Encounter (Signed)
LMOM for pt to return my call.  Please advise pt that her CT Abd/Pelvis is scheduled for Thursday, 02-24-14 at [facility], [place] [address] Arriving at 8:30.  If pt needs to reschedule this appt, please have her call [phone].  Thanks!  Anderson Malta

## 2014-02-24 ENCOUNTER — Ambulatory Visit
Admission: RE | Admit: 2014-02-24 | Discharge: 2014-02-24 | Disposition: A | Discharge status: Home / Self Care | Payer: BC Managed Care – PPO | Source: Ambulatory Visit | Attending: General Surgery | Admitting: General Surgery

## 2014-02-24 DIAGNOSIS — R222 Localized swelling, mass and lump, trunk: Secondary | ICD-10-CM

## 2014-02-25 ENCOUNTER — Ambulatory Visit
Admission: RE | Admit: 2014-02-25 | Discharge: 2014-02-25 | Disposition: A | Discharge status: Home / Self Care | Payer: BC Managed Care – PPO | Source: Ambulatory Visit | Attending: General Surgery | Admitting: General Surgery

## 2014-02-25 MED ORDER — IOHEXOL 300 MG/ML  SOLN
125.0000 mL | Freq: Once | INTRAMUSCULAR | Status: AC | PRN
  Administered 2014-02-25: 125 mL via INTRAVENOUS

## 2014-03-03 ENCOUNTER — Telehealth (INDEPENDENT_AMBULATORY_CARE_PROVIDER_SITE_OTHER): Payer: Self-pay

## 2014-03-03 NOTE — Telephone Encounter (Signed)
Called pt with CT results. She would like to proceed with surgery. Will ask for surgery orders next week.

## 2014-03-03 NOTE — Telephone Encounter (Signed)
Message copied by Carlene Coria on Thu Mar 03, 2014  2:53 PM ------      Message from: Luella Cook III      Created: Mon Feb 28, 2014 10:42 AM       Nothing seen on the CT. i will be happy to remove the mass if she would like that done ------

## 2014-03-08 ENCOUNTER — Other Ambulatory Visit (INDEPENDENT_AMBULATORY_CARE_PROVIDER_SITE_OTHER): Payer: Self-pay | Admitting: General Surgery

## 2014-06-28 ENCOUNTER — Other Ambulatory Visit: Payer: Self-pay | Admitting: Internal Medicine

## 2014-06-28 DIAGNOSIS — N281 Cyst of kidney, acquired: Secondary | ICD-10-CM

## 2014-06-28 DIAGNOSIS — K7689 Other specified diseases of liver: Secondary | ICD-10-CM

## 2014-07-01 ENCOUNTER — Other Ambulatory Visit: Payer: BC Managed Care – PPO

## 2014-07-04 ENCOUNTER — Ambulatory Visit
Admission: RE | Admit: 2014-07-04 | Discharge: 2014-07-04 | Disposition: A | Discharge status: Home / Self Care | Payer: BC Managed Care – PPO | Source: Ambulatory Visit | Attending: Internal Medicine | Admitting: Internal Medicine

## 2014-07-04 DIAGNOSIS — N281 Cyst of kidney, acquired: Secondary | ICD-10-CM

## 2014-07-04 DIAGNOSIS — K7689 Other specified diseases of liver: Secondary | ICD-10-CM

## 2014-07-25 ENCOUNTER — Other Ambulatory Visit: Payer: Self-pay

## 2014-07-25 DIAGNOSIS — Z1231 Encounter for screening mammogram for malignant neoplasm of breast: Secondary | ICD-10-CM

## 2014-08-04 ENCOUNTER — Ambulatory Visit
Admission: RE | Admit: 2014-08-04 | Discharge: 2014-08-04 | Disposition: A | Discharge status: Home / Self Care | Payer: BC Managed Care – PPO | Source: Ambulatory Visit

## 2014-08-04 DIAGNOSIS — Z1231 Encounter for screening mammogram for malignant neoplasm of breast: Secondary | ICD-10-CM

## 2015-09-12 ENCOUNTER — Other Ambulatory Visit: Payer: Self-pay

## 2015-09-12 DIAGNOSIS — Z1231 Encounter for screening mammogram for malignant neoplasm of breast: Secondary | ICD-10-CM

## 2015-10-30 ENCOUNTER — Ambulatory Visit
Admission: RE | Admit: 2015-10-30 | Discharge: 2015-10-30 | Disposition: A | Discharge status: Home / Self Care | Payer: BC Managed Care – PPO | Source: Ambulatory Visit

## 2015-10-30 DIAGNOSIS — Z1231 Encounter for screening mammogram for malignant neoplasm of breast: Secondary | ICD-10-CM

## 2016-11-11 ENCOUNTER — Other Ambulatory Visit: Payer: Self-pay | Admitting: Internal Medicine

## 2016-11-11 DIAGNOSIS — Z1231 Encounter for screening mammogram for malignant neoplasm of breast: Secondary | ICD-10-CM

## 2016-11-29 ENCOUNTER — Ambulatory Visit
Admission: RE | Admit: 2016-11-29 | Discharge: 2016-11-29 | Disposition: A | Discharge status: Home / Self Care | Payer: BC Managed Care – PPO | Source: Ambulatory Visit | Attending: Internal Medicine | Admitting: Internal Medicine

## 2016-11-29 DIAGNOSIS — Z1231 Encounter for screening mammogram for malignant neoplasm of breast: Secondary | ICD-10-CM

## 2016-12-06 ENCOUNTER — Other Ambulatory Visit: Payer: Self-pay | Admitting: Neurosurgery

## 2016-12-06 DIAGNOSIS — M4316 Spondylolisthesis, lumbar region: Secondary | ICD-10-CM

## 2016-12-17 ENCOUNTER — Ambulatory Visit
Admission: RE | Admit: 2016-12-17 | Discharge: 2016-12-17 | Disposition: A | Discharge status: Home / Self Care | Payer: BC Managed Care – PPO | Source: Ambulatory Visit | Attending: Neurosurgery | Admitting: Neurosurgery

## 2016-12-17 DIAGNOSIS — M4316 Spondylolisthesis, lumbar region: Secondary | ICD-10-CM

## 2016-12-17 MED ORDER — MEPERIDINE HCL 100 MG/ML IJ SOLN
75.0000 mg | Freq: Once | INTRAMUSCULAR | Status: AC
  Administered 2016-12-17: 75 mg via INTRAMUSCULAR

## 2016-12-17 MED ORDER — DIAZEPAM 5 MG PO TABS
10.0000 mg | ORAL_TABLET | Freq: Once | ORAL | Status: AC
  Administered 2016-12-17: 10 mg via ORAL

## 2016-12-17 MED ORDER — IOPAMIDOL (ISOVUE-M 200) INJECTION 41%
15.0000 mL | Freq: Once | INTRAMUSCULAR | Status: AC
  Administered 2016-12-17: 15 mL via INTRATHECAL

## 2016-12-17 MED ORDER — ONDANSETRON HCL 4 MG/2ML IJ SOLN
4.0000 mg | Freq: Once | INTRAMUSCULAR | Status: AC
  Administered 2016-12-17: 4 mg via INTRAMUSCULAR

## 2016-12-17 NOTE — Discharge Instructions (Signed)

## 2017-05-07 ENCOUNTER — Other Ambulatory Visit: Payer: Self-pay | Admitting: Neurosurgery

## 2017-05-28 ENCOUNTER — Encounter (HOSPITAL_COMMUNITY)
Admission: RE | Admit: 2017-05-28 | Discharge: 2017-05-28 | Disposition: A | Discharge status: Home / Self Care | Payer: BC Managed Care – PPO | Source: Ambulatory Visit | Attending: Neurosurgery | Admitting: Neurosurgery

## 2017-05-28 ENCOUNTER — Other Ambulatory Visit: Payer: Self-pay

## 2017-05-28 ENCOUNTER — Encounter (HOSPITAL_COMMUNITY): Payer: Self-pay

## 2017-05-28 DIAGNOSIS — I517 Cardiomegaly: Secondary | ICD-10-CM | POA: Diagnosis not present

## 2017-05-28 DIAGNOSIS — M4316 Spondylolisthesis, lumbar region: Secondary | ICD-10-CM | POA: Diagnosis not present

## 2017-05-28 DIAGNOSIS — Z01818 Encounter for other preprocedural examination: Secondary | ICD-10-CM | POA: Insufficient documentation

## 2017-05-28 HISTORY — DX: Unspecified osteoarthritis, unspecified site: M19.90

## 2017-05-28 LAB — BASIC METABOLIC PANEL
Anion gap: 8 (ref 5–15)
BUN: 15 mg/dL (ref 6–20)
CALCIUM: 9.1 mg/dL (ref 8.9–10.3)
CHLORIDE: 107 mmol/L (ref 101–111)
CO2: 27 mmol/L (ref 22–32)
CREATININE: 1.03 mg/dL — AB (ref 0.44–1.00)
GFR, EST NON AFRICAN AMERICAN: 57 mL/min — AB (ref >60)
Glucose, Bld: 92 mg/dL (ref 65–99)
Potassium: 3.5 mmol/L (ref 3.5–5.1)
SODIUM: 142 mmol/L (ref 135–145)

## 2017-05-28 LAB — TYPE AND SCREEN
ABO/RH(D): O POS
ANTIBODY SCREEN: NEGATIVE

## 2017-05-28 LAB — CBC
HCT: 36.3 % (ref 36.0–46.0)
Hemoglobin: 11.7 g/dL — ABNORMAL LOW (ref 12.0–15.0)
MCH: 29.6 pg (ref 26.0–34.0)
MCHC: 32.2 g/dL (ref 30.0–36.0)
MCV: 91.9 fL (ref 78.0–100.0)
PLATELETS: 198 10*3/uL (ref 150–400)
RBC: 3.95 MIL/uL (ref 3.87–5.11)
RDW: 14.9 % (ref 11.5–15.5)
WBC: 5.2 10*3/uL (ref 4.0–10.5)

## 2017-05-28 LAB — SURGICAL PCR SCREEN
MRSA, PCR: NEGATIVE
STAPHYLOCOCCUS AUREUS: NEGATIVE

## 2017-05-28 LAB — ABO/RH: ABO/RH(D): O POS

## 2017-05-28 NOTE — Pre-Procedure Instructions (Signed)
Gisele Pack Mayfield-Clarke  05/28/2017      [id number] - [place], [place] [address] [place] [address] Phone: [phone] Fax: [phone]  [id number] - [place], [place] - [address] [place] [address] Phone: [phone] Fax: [phone]    Your procedure is scheduled on Nov 12.  Report to [facility] Admitting at 530 A.M.  Call this number if you have problems the morning of surgery:  [phone]   Remember:  Do not eat food or drink liquids after midnight.  Take these medicines the morning of surgery with A SIP OF WATER -NONE  Stop taking aspirin, BC's, Goody's, Herbal medications, Fish Oil, Ibuprofen, Advil, Motrin, Aleve, Vitamins, Diclofenac (Voltaren)   Do not wear jewelry, make-up or nail polish.  Do not wear lotions, powders, or perfumes, or deoderant.  Do not shave 48 hours prior to surgery.  Men may shave face and neck.  Do not bring valuables to the hospital.  [facility] is not responsible for any belongings or valuables.  Contacts, dentures or bridgework may not be worn into surgery.  Leave your suitcase in the car.  After surgery it may be brought to your room.  For patients admitted to the hospital, discharge time will be determined by your treatment team.  Patients discharged the day of surgery will not be allowed to drive home.  Special instructions:  [place] - Preparing for Surgery  Before surgery, you can play an important role.  Because skin is not sterile, your skin needs to be as free of germs as possible.  You can reduce the number of germs on you skin by washing with CHG (chlorahexidine gluconate) soap before surgery.  CHG is an antiseptic cleaner which kills germs and bonds with the skin to continue killing germs even after washing.  Please DO NOT use if you have an allergy to CHG  or antibacterial soaps.  If your skin becomes reddened/irritated stop using the CHG and inform your nurse when you arrive at Short Stay.  Do not shave (including legs and underarms) for at least 48 hours prior to the first CHG shower.  You may shave your face.  Please follow these instructions carefully:   1.  Shower with CHG Soap the night before surgery and the    morning of Surgery.  2.  If you choose to wash your hair, wash your hair first as usual with your  normal shampoo.  3.  After you shampoo, rinse your hair and body thoroughly to remove the Shampoo.  4.  Use CHG as you would any other liquid soap.  You can apply chg directly  to the skin and wash gently with scrungie or a clean washcloth.  5.  Apply the CHG Soap to your body ONLY FROM THE NECK DOWN.  Do not use on open wounds or open sores.  Avoid contact with your eyes,  ears, mouth and genitals (private parts).  Wash genitals (private parts)   with your normal soap.  6.  Wash thoroughly, paying special attention to the area where your surgery  will be performed.  7.  Thoroughly rinse your body with warm water from the neck down.  8.  DO NOT shower/wash with your normal soap after using and rinsing off  the CHG Soap.  9.  Pat yourself dry with  a clean towel.            10.  Wear clean pajamas.            11.  Place clean sheets on your bed the night of your first shower and do not sleep with pets.  Day of Surgery  Do not apply any lotions/deoderants the morning of surgery.  Please wear clean clothes to the hospital/surgery center.     Please read over the following fact sheets that you were given. Pain Booklet, Coughing and Deep Breathing, MRSA Information and Surgical Site Infection Prevention

## 2017-05-28 NOTE — Progress Notes (Signed)
PCP is Dr. Wenda Low Denies ever seeing a cardiologist.  Denies chest pain, fever, or cough.  Denies ever having a stress test, card cath, or echo.

## 2017-05-28 NOTE — Progress Notes (Signed)
   05/28/17 1028  OBSTRUCTIVE SLEEP APNEA  Have you ever been diagnosed with sleep apnea through a sleep study? No  Do you snore loudly (loud enough to be heard through closed doors)?  1  Do you often feel tired, fatigued, or sleepy during the daytime (such as falling asleep during driving or talking to someone)? 0  Has anyone observed you stop breathing during your sleep? 0  Do you have, or are you being treated for high blood pressure? 1  BMI more than 35 kg/m2? 1  Age > 50 (1-yes) 1  Neck circumference greater than:Female 16 inches or larger, Female 17inches or larger? 0  Female Gender (Yes=1) 0  Obstructive Sleep Apnea Score 4  Score 5 or greater  Results sent to PCP

## 2017-06-01 MED ORDER — VANCOMYCIN HCL 10 G IV SOLR
1500.0000 mg | INTRAVENOUS | Status: AC
  Administered 2017-06-02: 1500 mg via INTRAVENOUS
  Filled 2017-06-01: qty 1500

## 2017-06-02 ENCOUNTER — Encounter (HOSPITAL_COMMUNITY): Payer: Self-pay | Admitting: *Deleted

## 2017-06-02 ENCOUNTER — Inpatient Hospital Stay (HOSPITAL_COMMUNITY): Payer: BC Managed Care – PPO

## 2017-06-02 ENCOUNTER — Encounter (HOSPITAL_COMMUNITY)
Admission: RE | Disposition: A | Discharge status: Home / Self Care | Payer: Self-pay | Source: Ambulatory Visit | Attending: Neurosurgery

## 2017-06-02 ENCOUNTER — Inpatient Hospital Stay (HOSPITAL_COMMUNITY): Payer: BC Managed Care – PPO | Admitting: Critical Care Medicine

## 2017-06-02 ENCOUNTER — Inpatient Hospital Stay (HOSPITAL_COMMUNITY)
Admission: RE | Admit: 2017-06-02 | Discharge: 2017-06-04 | DRG: 455 | Disposition: A | Discharge status: Home / Self Care | Payer: BC Managed Care – PPO | Source: Ambulatory Visit | Attending: Neurosurgery | Admitting: Neurosurgery

## 2017-06-02 DIAGNOSIS — M1990 Unspecified osteoarthritis, unspecified site: Secondary | ICD-10-CM | POA: Diagnosis present

## 2017-06-02 DIAGNOSIS — M4690 Unspecified inflammatory spondylopathy, site unspecified: Secondary | ICD-10-CM | POA: Diagnosis present

## 2017-06-02 DIAGNOSIS — M4316 Spondylolisthesis, lumbar region: Secondary | ICD-10-CM | POA: Diagnosis present

## 2017-06-02 DIAGNOSIS — E669 Obesity, unspecified: Secondary | ICD-10-CM | POA: Diagnosis present

## 2017-06-02 DIAGNOSIS — Z882 Allergy status to sulfonamides status: Secondary | ICD-10-CM | POA: Diagnosis not present

## 2017-06-02 DIAGNOSIS — J45909 Unspecified asthma, uncomplicated: Secondary | ICD-10-CM | POA: Diagnosis present

## 2017-06-02 DIAGNOSIS — Z880 Allergy status to penicillin: Secondary | ICD-10-CM | POA: Diagnosis not present

## 2017-06-02 DIAGNOSIS — Z6838 Body mass index (BMI) 38.0-38.9, adult: Secondary | ICD-10-CM

## 2017-06-02 DIAGNOSIS — Z91041 Radiographic dye allergy status: Secondary | ICD-10-CM

## 2017-06-02 DIAGNOSIS — I10 Essential (primary) hypertension: Secondary | ICD-10-CM | POA: Diagnosis present

## 2017-06-02 DIAGNOSIS — Z79899 Other long term (current) drug therapy: Secondary | ICD-10-CM | POA: Diagnosis not present

## 2017-06-02 DIAGNOSIS — Z91040 Latex allergy status: Secondary | ICD-10-CM

## 2017-06-02 DIAGNOSIS — M5116 Intervertebral disc disorders with radiculopathy, lumbar region: Secondary | ICD-10-CM | POA: Diagnosis present

## 2017-06-02 DIAGNOSIS — M48062 Spinal stenosis, lumbar region with neurogenic claudication: Principal | ICD-10-CM | POA: Diagnosis present

## 2017-06-02 DIAGNOSIS — Z91013 Allergy to seafood: Secondary | ICD-10-CM

## 2017-06-02 DIAGNOSIS — Z91018 Allergy to other foods: Secondary | ICD-10-CM | POA: Diagnosis not present

## 2017-06-02 DIAGNOSIS — Z419 Encounter for procedure for purposes other than remedying health state, unspecified: Secondary | ICD-10-CM

## 2017-06-02 SURGERY — POSTERIOR LUMBAR FUSION 2 LEVEL
Anesthesia: General

## 2017-06-02 MED ORDER — ONDANSETRON HCL 4 MG PO TABS: 4.0000 mg | ORAL_TABLET | Freq: Four times a day (QID) | ORAL | Status: DC | PRN

## 2017-06-02 MED ORDER — DEXAMETHASONE SODIUM PHOSPHATE 10 MG/ML IJ SOLN
INTRAMUSCULAR | Status: AC
  Filled 2017-06-02: qty 1

## 2017-06-02 MED ORDER — THROMBIN (RECOMBINANT) 20000 UNITS EX SOLR
CUTANEOUS | Status: AC
  Filled 2017-06-02: qty 20000

## 2017-06-02 MED ORDER — PHENYLEPHRINE 40 MCG/ML (10ML) SYRINGE FOR IV PUSH (FOR BLOOD PRESSURE SUPPORT)
PREFILLED_SYRINGE | INTRAVENOUS | Status: AC
  Filled 2017-06-02: qty 10

## 2017-06-02 MED ORDER — PHENYLEPHRINE HCL 10 MG/ML IJ SOLN
INTRAMUSCULAR | Status: DC | PRN
  Administered 2017-06-02: 12:00:00 via INTRAVENOUS
  Administered 2017-06-02: 30 ug/min via INTRAVENOUS
  Administered 2017-06-02: 20 ug/min via INTRAVENOUS

## 2017-06-02 MED ORDER — ARTIFICIAL TEARS OPHTHALMIC OINT
TOPICAL_OINTMENT | OPHTHALMIC | Status: AC
  Filled 2017-06-02: qty 3.5

## 2017-06-02 MED ORDER — HYDROMORPHONE HCL 1 MG/ML IJ SOLN
0.2500 mg | INTRAMUSCULAR | Status: DC | PRN
  Administered 2017-06-02 (×4): 0.5 mg via INTRAVENOUS

## 2017-06-02 MED ORDER — VANCOMYCIN HCL 1000 MG IV SOLR
INTRAVENOUS | Status: DC | PRN
  Administered 2017-06-02: 1000 mg via TOPICAL

## 2017-06-02 MED ORDER — LIDOCAINE 2% (20 MG/ML) 5 ML SYRINGE
INTRAMUSCULAR | Status: AC
  Filled 2017-06-02: qty 5

## 2017-06-02 MED ORDER — LACTATED RINGERS IV SOLN
INTRAVENOUS | Status: DC | PRN
  Administered 2017-06-02 (×2): via INTRAVENOUS

## 2017-06-02 MED ORDER — FENTANYL CITRATE (PF) 250 MCG/5ML IJ SOLN
INTRAMUSCULAR | Status: DC | PRN
  Administered 2017-06-02 (×9): 50 ug via INTRAVENOUS

## 2017-06-02 MED ORDER — BUPIVACAINE-EPINEPHRINE (PF) 0.5% -1:200000 IJ SOLN
INTRAMUSCULAR | Status: DC | PRN
  Administered 2017-06-02: 10 mL

## 2017-06-02 MED ORDER — VANCOMYCIN HCL 10 G IV SOLR
1500.0000 mg | Freq: Once | INTRAVENOUS | Status: AC
  Administered 2017-06-02: 1500 mg via INTRAVENOUS
  Filled 2017-06-02: qty 1500

## 2017-06-02 MED ORDER — PHENYLEPHRINE 40 MCG/ML (10ML) SYRINGE FOR IV PUSH (FOR BLOOD PRESSURE SUPPORT)
PREFILLED_SYRINGE | INTRAVENOUS | Status: DC | PRN
  Administered 2017-06-02: 40 ug via INTRAVENOUS

## 2017-06-02 MED ORDER — ROCURONIUM BROMIDE 10 MG/ML (PF) SYRINGE
PREFILLED_SYRINGE | INTRAVENOUS | Status: AC
  Filled 2017-06-02: qty 5

## 2017-06-02 MED ORDER — BUPIVACAINE LIPOSOME 1.3 % IJ SUSP
20.0000 mL | INTRAMUSCULAR | Status: DC
  Filled 2017-06-02: qty 20

## 2017-06-02 MED ORDER — IRBESARTAN 150 MG PO TABS
150.0000 mg | ORAL_TABLET | Freq: Every day | ORAL | Status: DC
  Administered 2017-06-02 – 2017-06-03 (×2): 150 mg via ORAL
  Filled 2017-06-02 (×5): qty 1

## 2017-06-02 MED ORDER — MEPERIDINE HCL 25 MG/ML IJ SOLN: 6.2500 mg | INTRAMUSCULAR | Status: DC | PRN

## 2017-06-02 MED ORDER — MENTHOL 3 MG MT LOZG: 1.0000 | LOZENGE | OROMUCOSAL | Status: DC | PRN

## 2017-06-02 MED ORDER — ROCURONIUM BROMIDE 10 MG/ML (PF) SYRINGE
PREFILLED_SYRINGE | INTRAVENOUS | Status: DC | PRN
  Administered 2017-06-02: 20 mg via INTRAVENOUS
  Administered 2017-06-02: 10 mg via INTRAVENOUS
  Administered 2017-06-02: 50 mg via INTRAVENOUS
  Administered 2017-06-02 (×3): 20 mg via INTRAVENOUS

## 2017-06-02 MED ORDER — THROMBIN (RECOMBINANT) 20000 UNITS EX SOLR
OROMUCOSAL | Status: DC | PRN
  Administered 2017-06-02: 08:00:00 via TOPICAL

## 2017-06-02 MED ORDER — 0.9 % SODIUM CHLORIDE (POUR BTL) OPTIME
TOPICAL | Status: DC | PRN
  Administered 2017-06-02: 1000 mL

## 2017-06-02 MED ORDER — LIDOCAINE 2% (20 MG/ML) 5 ML SYRINGE
INTRAMUSCULAR | Status: DC | PRN
  Administered 2017-06-02: 60 mg via INTRAVENOUS

## 2017-06-02 MED ORDER — METOCLOPRAMIDE HCL 5 MG/ML IJ SOLN
INTRAMUSCULAR | Status: AC
  Filled 2017-06-02: qty 2

## 2017-06-02 MED ORDER — METOCLOPRAMIDE HCL 5 MG/ML IJ SOLN: 10.0000 mg | Freq: Once | INTRAMUSCULAR | Status: DC | PRN

## 2017-06-02 MED ORDER — PHENOL 1.4 % MT LIQD: 1.0000 | OROMUCOSAL | Status: DC | PRN

## 2017-06-02 MED ORDER — ONDANSETRON HCL 4 MG/2ML IJ SOLN: 4.0000 mg | Freq: Four times a day (QID) | INTRAMUSCULAR | Status: DC | PRN

## 2017-06-02 MED ORDER — OXYCODONE HCL 5 MG PO TABS
10.0000 mg | ORAL_TABLET | ORAL | Status: DC | PRN
  Administered 2017-06-02 (×2): 10 mg via ORAL
  Filled 2017-06-02: qty 2

## 2017-06-02 MED ORDER — ONDANSETRON HCL 4 MG/2ML IJ SOLN
INTRAMUSCULAR | Status: AC
  Filled 2017-06-02: qty 2

## 2017-06-02 MED ORDER — HYDROCODONE-ACETAMINOPHEN 5-325 MG PO TABS
1.0000 | ORAL_TABLET | ORAL | Status: DC | PRN
Start: 2017-06-02 — End: 2017-06-04
  Administered 2017-06-02 (×2): 1 via ORAL
  Administered 2017-06-03 – 2017-06-04 (×9): 2 via ORAL
  Filled 2017-06-02 (×2): qty 2
  Filled 2017-06-02: qty 1
  Filled 2017-06-02 (×2): qty 2
  Filled 2017-06-02: qty 1
  Filled 2017-06-02 (×5): qty 2

## 2017-06-02 MED ORDER — GLYCOPYRROLATE 0.2 MG/ML IV SOSY
PREFILLED_SYRINGE | INTRAVENOUS | Status: DC | PRN
  Administered 2017-06-02: .2 mg via INTRAVENOUS

## 2017-06-02 MED ORDER — SODIUM CHLORIDE 0.9% FLUSH: 3.0000 mL | INTRAVENOUS | Status: DC | PRN

## 2017-06-02 MED ORDER — CYCLOBENZAPRINE HCL 10 MG PO TABS
ORAL_TABLET | ORAL | Status: AC
  Filled 2017-06-02: qty 1

## 2017-06-02 MED ORDER — BUPIVACAINE-EPINEPHRINE (PF) 0.5% -1:200000 IJ SOLN
INTRAMUSCULAR | Status: AC
  Filled 2017-06-02: qty 30

## 2017-06-02 MED ORDER — BISACODYL 10 MG RE SUPP: 10.0000 mg | Freq: Every day | RECTAL | Status: DC | PRN

## 2017-06-02 MED ORDER — HYDROMORPHONE HCL 1 MG/ML IJ SOLN
INTRAMUSCULAR | Status: AC
  Administered 2017-06-02: 0.5 mg via INTRAVENOUS
  Filled 2017-06-02: qty 1

## 2017-06-02 MED ORDER — ROCURONIUM BROMIDE 10 MG/ML (PF) SYRINGE
PREFILLED_SYRINGE | INTRAVENOUS | Status: AC
  Filled 2017-06-02: qty 10

## 2017-06-02 MED ORDER — ZOLPIDEM TARTRATE 5 MG PO TABS: 5.0000 mg | ORAL_TABLET | Freq: Every evening | ORAL | Status: DC | PRN

## 2017-06-02 MED ORDER — METOCLOPRAMIDE HCL 5 MG/ML IJ SOLN
INTRAMUSCULAR | Status: DC | PRN
  Administered 2017-06-02: 10 mg via INTRAVENOUS

## 2017-06-02 MED ORDER — BACITRACIN ZINC 500 UNIT/GM EX OINT
TOPICAL_OINTMENT | CUTANEOUS | Status: AC
  Filled 2017-06-02: qty 28.35

## 2017-06-02 MED ORDER — DEXAMETHASONE SODIUM PHOSPHATE 10 MG/ML IJ SOLN
INTRAMUSCULAR | Status: DC | PRN
  Administered 2017-06-02: 10 mg via INTRAVENOUS

## 2017-06-02 MED ORDER — OXYCODONE HCL 5 MG PO TABS: 5.0000 mg | ORAL_TABLET | ORAL | Status: DC | PRN

## 2017-06-02 MED ORDER — PROPOFOL 10 MG/ML IV BOLUS
INTRAVENOUS | Status: DC | PRN
  Administered 2017-06-02: 50 mg via INTRAVENOUS

## 2017-06-02 MED ORDER — CHLORHEXIDINE GLUCONATE CLOTH 2 % EX PADS: 6.0000 | MEDICATED_PAD | Freq: Once | CUTANEOUS | Status: DC

## 2017-06-02 MED ORDER — MAGNESIUM OXIDE -MG SUPPLEMENT 500 MG PO CAPS: 1000.0000 mg | ORAL_CAPSULE | Freq: Every day | ORAL | Status: DC

## 2017-06-02 MED ORDER — HYDROCHLOROTHIAZIDE 12.5 MG PO CAPS
12.5000 mg | ORAL_CAPSULE | Freq: Every day | ORAL | Status: DC
  Administered 2017-06-02 – 2017-06-03 (×2): 12.5 mg via ORAL
  Filled 2017-06-02 (×3): qty 1

## 2017-06-02 MED ORDER — VALSARTAN-HYDROCHLOROTHIAZIDE 160-12.5 MG PO TABS: 1.0000 | ORAL_TABLET | Freq: Every day | ORAL | Status: DC

## 2017-06-02 MED ORDER — MORPHINE SULFATE (PF) 4 MG/ML IV SOLN: 4.0000 mg | INTRAVENOUS | Status: DC | PRN

## 2017-06-02 MED ORDER — ACETAMINOPHEN 650 MG RE SUPP: 650.0000 mg | RECTAL | Status: DC | PRN

## 2017-06-02 MED ORDER — SODIUM CHLORIDE 0.9% FLUSH
3.0000 mL | Freq: Two times a day (BID) | INTRAVENOUS | Status: DC
  Administered 2017-06-02: 3 mL via INTRAVENOUS

## 2017-06-02 MED ORDER — FENTANYL CITRATE (PF) 250 MCG/5ML IJ SOLN
INTRAMUSCULAR | Status: AC
  Filled 2017-06-02: qty 5

## 2017-06-02 MED ORDER — VANCOMYCIN HCL 1000 MG IV SOLR
INTRAVENOUS | Status: AC
  Filled 2017-06-02: qty 1000

## 2017-06-02 MED ORDER — ONDANSETRON HCL 4 MG/2ML IJ SOLN
INTRAMUSCULAR | Status: DC | PRN
  Administered 2017-06-02: 4 mg via INTRAVENOUS

## 2017-06-02 MED ORDER — SUGAMMADEX SODIUM 200 MG/2ML IV SOLN
INTRAVENOUS | Status: AC
  Filled 2017-06-02: qty 2

## 2017-06-02 MED ORDER — SODIUM CHLORIDE 0.9 % IV SOLN: 250.0000 mL | INTRAVENOUS | Status: DC

## 2017-06-02 MED ORDER — ACETAMINOPHEN 325 MG PO TABS: 650.0000 mg | ORAL_TABLET | ORAL | Status: DC | PRN

## 2017-06-02 MED ORDER — MIDAZOLAM HCL 5 MG/5ML IJ SOLN
INTRAMUSCULAR | Status: DC | PRN
  Administered 2017-06-02: 2 mg via INTRAVENOUS

## 2017-06-02 MED ORDER — THROMBIN (RECOMBINANT) 5000 UNITS EX SOLR
CUTANEOUS | Status: AC
  Filled 2017-06-02: qty 5000

## 2017-06-02 MED ORDER — MIDAZOLAM HCL 2 MG/2ML IJ SOLN
INTRAMUSCULAR | Status: AC
  Filled 2017-06-02: qty 2

## 2017-06-02 MED ORDER — BACITRACIN ZINC 500 UNIT/GM EX OINT
TOPICAL_OINTMENT | CUTANEOUS | Status: DC | PRN
  Administered 2017-06-02: 1 via TOPICAL

## 2017-06-02 MED ORDER — OXYCODONE HCL 5 MG PO TABS
ORAL_TABLET | ORAL | Status: AC
  Filled 2017-06-02: qty 2

## 2017-06-02 MED ORDER — CYCLOBENZAPRINE HCL 10 MG PO TABS
10.0000 mg | ORAL_TABLET | Freq: Three times a day (TID) | ORAL | Status: DC | PRN
  Administered 2017-06-02 – 2017-06-04 (×2): 10 mg via ORAL
  Filled 2017-06-02: qty 1

## 2017-06-02 MED ORDER — BACITRACIN 50000 UNITS IM SOLR
INTRAMUSCULAR | Status: DC | PRN
  Administered 2017-06-02: 08:00:00

## 2017-06-02 MED ORDER — THROMBIN (RECOMBINANT) 5000 UNITS EX SOLR
OROMUCOSAL | Status: DC | PRN
  Administered 2017-06-02: 08:00:00 via TOPICAL

## 2017-06-02 MED ORDER — DOCUSATE SODIUM 100 MG PO CAPS
100.0000 mg | ORAL_CAPSULE | Freq: Two times a day (BID) | ORAL | Status: DC
  Administered 2017-06-02 – 2017-06-04 (×4): 100 mg via ORAL
  Filled 2017-06-02 (×4): qty 1

## 2017-06-02 SURGICAL SUPPLY — 77 items
APL SKNCLS STERI-STRIP NONHPOA (GAUZE/BANDAGES/DRESSINGS) ×1
BAG DECANTER FOR FLEXI CONT (MISCELLANEOUS) ×3 IMPLANT
BENZOIN TINCTURE PRP APPL 2/3 (GAUZE/BANDAGES/DRESSINGS) ×3 IMPLANT
BLADE CLIPPER SURG (BLADE) IMPLANT
BUR MATCHSTICK NEURO 3.0 LAGG (BURR) ×3 IMPLANT
BUR PRECISION FLUTE 6.0 (BURR) ×3 IMPLANT
CAGE ALTERA 10X31X9-13 15D (Cage) ×2 IMPLANT
CAGE ALTERA 9-13-15-31MM (Cage) ×2 IMPLANT
CANISTER SUCT 3000ML PPV (MISCELLANEOUS) ×3 IMPLANT
CAP REVERE LOCKING (Cap) ×12 IMPLANT
CARTRIDGE OIL MAESTRO DRILL (MISCELLANEOUS) ×1 IMPLANT
CATH FOLEY LATEX FREE 16FR (CATHETERS) ×3
CATH FOLEY LF 16FR (CATHETERS) IMPLANT
CLOSURE WOUND 1/2 X4 (GAUZE/BANDAGES/DRESSINGS) ×1
CONT SPEC 4OZ CLIKSEAL STRL BL (MISCELLANEOUS) ×3 IMPLANT
COVER BACK TABLE 60X90IN (DRAPES) ×4 IMPLANT
DIFFUSER DRILL AIR PNEUMATIC (MISCELLANEOUS) ×3 IMPLANT
DRAPE C-ARM 42X72 X-RAY (DRAPES) ×8 IMPLANT
DRAPE HALF SHEET 40X57 (DRAPES) ×3 IMPLANT
DRAPE INCISE 23X17 IOBAN STRL (DRAPES) ×2
DRAPE INCISE 23X17 STRL (DRAPES) IMPLANT
DRAPE INCISE IOBAN 23X17 STRL (DRAPES) ×1 IMPLANT
DRAPE LAPAROTOMY 100X72X124 (DRAPES) ×3 IMPLANT
DRAPE POUCH INSTRU U-SHP 10X18 (DRAPES) ×3 IMPLANT
DRAPE SURG 17X23 STRL (DRAPES) ×12 IMPLANT
ELECT BLADE 4.0 EZ CLEAN MEGAD (MISCELLANEOUS) ×3
ELECT REM PT RETURN 9FT ADLT (ELECTROSURGICAL) ×3
ELECTRODE BLDE 4.0 EZ CLN MEGD (MISCELLANEOUS) ×1 IMPLANT
ELECTRODE REM PT RTRN 9FT ADLT (ELECTROSURGICAL) ×1 IMPLANT
GAUZE SPONGE 4X4 12PLY STRL (GAUZE/BANDAGES/DRESSINGS) ×3 IMPLANT
GAUZE SPONGE 4X4 16PLY XRAY LF (GAUZE/BANDAGES/DRESSINGS) ×3 IMPLANT
GLOVE BIO SURGEON STRL SZ8 (GLOVE) ×2 IMPLANT
GLOVE BIO SURGEON STRL SZ8.5 (GLOVE) ×2 IMPLANT
GLOVE BIOGEL PI IND STRL 6.5 (GLOVE) IMPLANT
GLOVE BIOGEL PI INDICATOR 6.5 (GLOVE) ×2
GLOVE EXAM NITRILE LRG STRL (GLOVE) IMPLANT
GLOVE EXAM NITRILE XL STR (GLOVE) IMPLANT
GLOVE EXAM NITRILE XS STR PU (GLOVE) IMPLANT
GLOVE SURG SS PI 6.5 STRL IVOR (GLOVE) ×4 IMPLANT
GLOVE SURG SS PI 7.0 STRL IVOR (GLOVE) ×2 IMPLANT
GLOVE SURG SS PI 7.5 STRL IVOR (GLOVE) ×14 IMPLANT
GLOVE SURG SS PI 8.0 STRL IVOR (GLOVE) ×4 IMPLANT
GLOVE SURG SS PI 8.5 STRL IVOR (GLOVE) ×4
GLOVE SURG SS PI 8.5 STRL STRW (GLOVE) IMPLANT
GOWN STRL REUS W/ TWL LRG LVL3 (GOWN DISPOSABLE) IMPLANT
GOWN STRL REUS W/ TWL XL LVL3 (GOWN DISPOSABLE) ×2 IMPLANT
GOWN STRL REUS W/TWL 2XL LVL3 (GOWN DISPOSABLE) IMPLANT
GOWN STRL REUS W/TWL LRG LVL3 (GOWN DISPOSABLE) ×12
GOWN STRL REUS W/TWL XL LVL3 (GOWN DISPOSABLE) ×6
KIT BASIN OR (CUSTOM PROCEDURE TRAY) ×3 IMPLANT
KIT ROOM TURNOVER OR (KITS) ×3 IMPLANT
NDL HYPO 21X1.5 SAFETY (NEEDLE) IMPLANT
NEEDLE HYPO 21X1.5 SAFETY (NEEDLE) IMPLANT
NEEDLE HYPO 22GX1.5 SAFETY (NEEDLE) ×3 IMPLANT
NS IRRIG 1000ML POUR BTL (IV SOLUTION) ×3 IMPLANT
OIL CARTRIDGE MAESTRO DRILL (MISCELLANEOUS) ×3
PACK LAMINECTOMY NEURO (CUSTOM PROCEDURE TRAY) ×3 IMPLANT
PAD ARMBOARD 7.5X6 YLW CONV (MISCELLANEOUS) ×9 IMPLANT
PATTIES SURGICAL .5 X.5 (GAUZE/BANDAGES/DRESSINGS) ×2 IMPLANT
PATTIES SURGICAL .5 X1 (DISPOSABLE) IMPLANT
PATTIES SURGICAL 1X1 (DISPOSABLE) ×2 IMPLANT
ROD REVERE 7.5MM (Rod) ×4 IMPLANT
SCREW REVERE 6.35 6.5MMX45 (Screw) ×4 IMPLANT
SCREW REVERE 6.5X50MM (Screw) ×8 IMPLANT
SPONGE LAP 4X18 X RAY DECT (DISPOSABLE) IMPLANT
SPONGE NEURO XRAY DETECT 1X3 (DISPOSABLE) IMPLANT
SPONGE SURGIFOAM ABS GEL 100 (HEMOSTASIS) ×3 IMPLANT
STRIP BIOACTIVE 20CC 25X100X8 (Miscellaneous) ×2 IMPLANT
STRIP CLOSURE SKIN 1/2X4 (GAUZE/BANDAGES/DRESSINGS) ×2 IMPLANT
SUT VIC AB 1 CT1 18XBRD ANBCTR (SUTURE) ×2 IMPLANT
SUT VIC AB 1 CT1 8-18 (SUTURE) ×6
SUT VIC AB 2-0 CP2 18 (SUTURE) ×6 IMPLANT
TAPE CLOTH SURG 4X10 WHT LF (GAUZE/BANDAGES/DRESSINGS) ×2 IMPLANT
TOWEL GREEN STERILE (TOWEL DISPOSABLE) ×3 IMPLANT
TOWEL GREEN STERILE FF (TOWEL DISPOSABLE) ×3 IMPLANT
TRAY FOLEY W/METER SILVER 16FR (SET/KITS/TRAYS/PACK) ×1 IMPLANT
WATER STERILE IRR 1000ML POUR (IV SOLUTION) ×3 IMPLANT

## 2017-06-02 NOTE — Progress Notes (Signed)
Subjective:  The patient is somnolent but arousable. She is in no apparent distress.  Objective: Vital signs in last 24 hours: Temp:  [97.6 F (36.4 C)-97.9 F (36.6 C)] 97.6 F (36.4 C) (11/12 1328) Pulse Rate:  [67-88] 77 (11/12 1328) Resp:  [8-19] 9 (11/12 1328) BP: (111-175)/(76-96) 111/76 (11/12 1315) SpO2:  [93 %-100 %] 93 % (11/12 1328) Weight:  [104.3 kg (230 lb)] 104.3 kg (230 lb) (11/12 7673)  Intake/Output from previous day: No intake/output data recorded. Intake/Output this shift: Total I/O In: 1700 [I.V.:1700] Out: 1250 [Urine:900; Blood:350]  Physical exam the patient is somnolent but arousable. She is moving her lower extremities well.  Lab Results: No results for input(s): WBC, HGB, HCT, PLT in the last 72 hours. BMET No results for input(s): NA, K, CL, CO2, GLUCOSE, BUN, CREATININE, CALCIUM in the last 72 hours.  Studies/Results: Dg Lumbar Spine 2-3 Views  Result Date: 06/02/2017 CLINICAL DATA:  Posterior lumbar interbody fusion EXAM: LUMBAR SPINE - 2-3 VIEW; DG C-ARM 61-120 MIN COMPARISON:  None FLUOROSCOPY TIME:  36 seconds FINDINGS: Two intraoperative fluoroscopic spot images of the lumbar spine are provided. Posterior lumbar interbody fusion from L3 through L5 with bilateral pedicle screws at each level and interbody spacers at L3-4 and L4-5. IMPRESSION: Posterior lumbar interbody fusion L3-L5. Electronically Signed   By: Kathreen Devoid   On: 06/02/2017 12:32   Dg Lumbar Spine 1 View  Result Date: 06/02/2017 CLINICAL DATA:  Spondylolisthesis EXAM: LUMBAR SPINE - 1 VIEW COMPARISON:  Postmyelogram cervical spine CT Dec 17, 2016 FINDINGS: Cross-table lateral lumbar image time stamped 8:10:55 submitted. Metallic probe tip is posterior to the superior aspect of the L4 vertebral body. There is mild anterolisthesis of L3 on L4. There is disc space narrowing at L3-4, L4-5, L5-S1. No fracture. IMPRESSION: Metallic probe tip posterior to the superior aspect of the L4  vertebral body. Mild anterolisthesis of L3 on L4. 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 Electronically Signed   By: Lowella Grip III M.D.   On: 06/02/2017 08:59   Dg C-arm 1-60 Min  Result Date: 06/02/2017 CLINICAL DATA:  Posterior lumbar interbody fusion EXAM: LUMBAR SPINE - 2-3 VIEW; DG C-ARM 61-120 MIN COMPARISON:  None FLUOROSCOPY TIME:  36 seconds FINDINGS: Two intraoperative fluoroscopic spot images of the lumbar spine are provided. Posterior lumbar interbody fusion from L3 through L5 with bilateral pedicle screws at each level and interbody spacers at L3-4 and L4-5. IMPRESSION: Posterior lumbar interbody fusion L3-L5. Electronically Signed   By: Kathreen Devoid   On: 06/02/2017 12:32    Assessment/Plan: The patient is doing well. I spoke with her husband.  LOS: 0 days     Candace Ramus D 06/02/2017, 1:29 PM

## 2017-06-02 NOTE — Transfer of Care (Signed)
Immediate Anesthesia Transfer of Care Note  Patient: Suzanne Pearson  Procedure(s) Performed: POSTERIOR LUMBAR INTERBODY FUSION, INTERBODY PROSTHESISI, POSTERIOR INSTRUMENTATION AND FUSION LUMBAR 3- LUMBAR 4, LUMBAR 4- LUMBAR 5 (N/A )  Patient Location: PACU  Anesthesia Type:General  Level of Consciousness: awake, alert  and oriented  Airway & Oxygen Therapy: Patient Spontanous Breathing and Patient connected to nasal cannula oxygen  Post-op Assessment: Report given to RN and Post -op Vital signs reviewed and stable  Post vital signs: Reviewed and stable  Last Vitals:  Vitals:   06/02/17 0621  BP: (!) 175/96  Pulse: 67  Resp: 19  Temp: 36.6 C  SpO2: 100%    Last Pain:  Vitals:   06/02/17 0630  TempSrc:   PainSc: 9       Patients Stated Pain Goal: 4 (88/11/03 1594)  Complications: No apparent anesthesia complications

## 2017-06-02 NOTE — Progress Notes (Signed)
Pharmacy Antibiotic Note  Suzanne Pearson is a 66 y.o. female admitted on 06/02/2017 with surgical prophylaxis.  Pharmacy has been consulted for vancomycin dosing.  Received pre-op vancomycin 1500mg  IV at ~0930 this morning. No drain in place.  Plan: Vancomycin 1500mg  IV x1 at 2100 Pharmacy to sign off as no future doses required  Height: 5\' 5"  (165.1 cm) Weight: 230 lb (104.3 kg) IBW/kg (Calculated) : 57  Temp (24hrs), Avg:97.7 F (36.5 C), Min:97.5 F (36.4 C), Max:97.9 F (36.6 C)  Recent Labs  Lab 05/28/17 1034  WBC 5.2  CREATININE 1.03*    Estimated Creatinine Clearance: 67 mL/min (A) (by C-G formula based on SCr of 1.03 mg/dL (H)).    Allergies  Allergen Reactions  . Cranberry Other (See Comments)    Gives her migraines  . Latex Hives and Itching  . Penicillins Hives and Rash    Has patient had a PCN reaction causing immediate rash, facial/tongue/throat swelling, SOB or lightheadedness with hypotension: Yes Has patient had a PCN reaction causing severe rash involving mucus membranes or skin necrosis: Yes Has patient had a PCN reaction that required hospitalization: No Has patient had a PCN reaction occurring within the last 10 years: No If all of the above answers are "NO", then may proceed with Cephalosporin use.   Marland Kitchen Pineapple Other (See Comments)    Gives her migraines  . Shrimp [Shellfish Allergy] Hives    She can eat them but gets hives if she touches them when they're raw  . Sulfa Antibiotics Hives and Rash  . Tomato Other (See Comments)    Gives her migraines  . Iodinated Diagnostic Agents Itching    Pre-medicate with Benadryl 50mg  PO      Thank you for allowing pharmacy to be a part of this patient's care.  Paizley Ramella 06/02/2017 2:04 PM

## 2017-06-02 NOTE — Op Note (Signed)
Brief history: The patient is a 66 year old black female who has complained of back, buttock and leg pain consistent with neurogenic claudication. She has failed medical management and was worked up with a lumbar myelo CT which demonstrated an L3-4 and L4-5 spondylolisthesis with spinal stenosis. I discussed the various treatment options with the patient. She has decided to proceed with surgery.  Preoperative diagnosis: L3-4 and L4-5 spondylolisthesis, Degenerative disc disease, spinal stenosis compressing the L3, L4 and L5 nerve roots; lumbago; lumbar radiculopathy; neurogenic claudication  Postoperative diagnosis: The same  Procedure: L3-4 bilateral laminotomy/foraminotomy, L4-5 laminectomy/foraminotomies to decompress the bilateral L3, L4 and L5 nerve roots(the work required to do this was in addition to the work required to do the posterior lumbar interbody fusion because of the patient's spinal stenosis, facet arthropathy. Etc. requiring a wide decompression of the nerve roots.); L3-4 and L4-5 transforaminal lumbar interbody fusion with local morselized autograft bone and Kinnex graft extender; insertion of interbody prosthesis at L3-4 and L4-5 (globus peek expandable interbody prosthesis); posterior segmental instrumentation from L3 to L5 with globus titanium pedicle screws and rods; posterior lateral arthrodesis at L3-4 and L4-5 with local morselized autograft bone and Kinnex bone graft extender.  Surgeon: Dr. Earle Gell  Asst.: Dr. Cyndy Freeze  Anesthesia: Gen. endotracheal  Estimated blood loss: 250 mL  Drains: None  Complications: None  Description of procedure: The patient was brought to the operating room by the anesthesia team. General endotracheal anesthesia was induced. The patient was turned to the prone position on the Wilson frame. The patient's lumbosacral region was then prepared with Betadine scrub and Betadine solution. Sterile drapes were applied.  I then injected the area to  be incised with Marcaine with epinephrine solution. I then used the scalpel to make a linear midline incision over the L3-4 and L4-5 interspace. I then used electrocautery to perform a bilateral subperiosteal dissection exposing the spinous process and lamina of L3, L4 and L5. We then obtained intraoperative radiograph to confirm our location. We then inserted the Verstrac retractor to provide exposure.  I began the decompression by using the high speed drill to perform laminotomies at L3-4 and L4-5 bilaterally. We then used the Kerrison punches to complete the L4-5 laminectomy and to widen the bilateral L3-4 laminotomy/foraminotomies  and removed the ligamentum flavum at L3-4 and L4-5 bilaterally. We used the Kerrison punches to remove the medial facets at L3-4 and L4-5. We performed wide foraminotomies about the bilateral L3, L4 and L5 nerve roots completing the decompression.  We now turned our attention to the posterior lumbar interbody fusion. I used a scalpel to incise the intervertebral disc at L3-4 and L4-5 bilaterally. I then performed a partial intervertebral discectomy at L3-4 and L4-5 bilaterally using the pituitary forceps. We prepared the vertebral endplates at D6-6 and Y4-0 bilaterally for the fusion by removing the soft tissues with the curettes. We then used the trial spacers to pick the appropriate sized interbody prosthesis. We prefilled his prosthesis with a combination of local morselized autograft bone that we obtained during the decompression as well as Kinnex bone graft extender. We inserted the prefilled prosthesis into the interspace at L3-4 and L4-5, we expanded the prosthesis. There was a good snug fit of the prosthesis in the interspace. We then filled and the remainder of the intervertebral disc space with local morselized autograft bone and Kinnex. This completed the posterior lumbar interbody arthrodesis.  We now turned attention to the instrumentation. Under fluoroscopic  guidance we cannulated the bilateral L3,  L4 and L5 pedicles with the bone probe. We then removed the bone probe. We then tapped the pedicle with a 5.5 millimeter tap. We then removed the tap. We probed inside the tapped pedicle with a ball probe to rule out cortical breaches. We then inserted a 6.5 x 50 and 45 millimeter pedicle screw into the L3, L4 and L5 pedicles bilaterally under fluoroscopic guidance. We then palpated along the medial aspect of the pedicles to rule out cortical breaches. There were none. The nerve roots were not injured. We then connected the unilateral pedicle screws with a lordotic rod. We compressed the construct and secured the rod in place with the caps. We then tightened the caps appropriately. This completed the instrumentation from L3-L5 bilaterally.  We now turned our attention to the posterior lateral arthrodesis at L3-4 and L4-5 bilaterally. We used the high-speed drill to decorticate the remainder of the facets, pars, transverse process at L3-4 and L4-5 bilaterally. We then applied a combination of local morselized autograft bone and Kinnex bone graft extender over these decorticated posterior lateral structures. This completed the posterior lateral arthrodesis.  We then obtained hemostasis using bipolar electrocautery. We irrigated the wound out with bacitracin solution. We inspected the thecal sac and nerve roots and noted they were well decompressed. We then removed the retractor. We placed vancomycin powder in the wound. We reapproximated patient's thoracolumbar fascia with interrupted #1 Vicryl suture. We reapproximated patient's subcutaneous tissue with interrupted 2-0 Vicryl suture. The reapproximated patient's skin with Steri-Strips and benzoin. The wound was then coated with bacitracin ointment. A sterile dressing was applied. The drapes were removed. The patient was subsequently returned to the supine position where they were extubated by the anesthesia team. He was  then transported to the post anesthesia care unit in stable condition. All sponge instrument and needle counts were reportedly correct at the end of this case.

## 2017-06-02 NOTE — Anesthesia Procedure Notes (Signed)
Procedure Name: Intubation Date/Time: 06/02/2017 7:29 AM Performed by: Wilburn Cornelia, CRNA Pre-anesthesia Checklist: Patient identified, Emergency Drugs available, Suction available, Patient being monitored and Timeout performed Patient Re-evaluated:Patient Re-evaluated prior to induction Oxygen Delivery Method: Circle system utilized Preoxygenation: Pre-oxygenation with 100% oxygen Induction Type: IV induction Ventilation: Mask ventilation without difficulty and Oral airway inserted - appropriate to patient size Laryngoscope Size: Mac and 3 Grade View: Grade I Tube type: Oral Tube size: 7.0 mm Number of attempts: 1 Airway Equipment and Method: Stylet Placement Confirmation: ETT inserted through vocal cords under direct vision,  positive ETCO2,  breath sounds checked- equal and bilateral and CO2 detector Secured at: 21 cm Tube secured with: Tape Dental Injury: Teeth and Oropharynx as per pre-operative assessment

## 2017-06-02 NOTE — Anesthesia Preprocedure Evaluation (Signed)
Anesthesia Evaluation  Patient identified by MRN, date of birth, ID band Patient awake    Reviewed: Allergy & Precautions, NPO status , Patient's Chart, lab work & pertinent test results  Airway Mallampati: II  TM Distance: >3 FB Neck ROM: Full    Dental no notable dental hx. (+) Teeth Intact   Pulmonary shortness of breath and with exertion, asthma ,    Pulmonary exam normal breath sounds clear to auscultation       Cardiovascular hypertension, Pt. on medications Normal cardiovascular exam Rhythm:Regular Rate:Normal     Neuro/Psych  Headaches, negative psych ROS   GI/Hepatic negative GI ROS, Neg liver ROS,   Endo/Other  Obesity  Renal/GU negative Renal ROS  negative genitourinary   Musculoskeletal  (+) Arthritis , Osteoarthritis,  Spondylolisthesis L3-4, L4-5   Abdominal (+) + obese,   Peds  Hematology negative hematology ROS (+)   Anesthesia Other Findings   Reproductive/Obstetrics                             Anesthesia Physical Anesthesia Plan  ASA: II  Anesthesia Plan: General   Post-op Pain Management:    Induction: Intravenous  PONV Risk Score and Plan: 4 or greater and Midazolam, Dexamethasone, Ondansetron, Metaclopromide and Treatment may vary due to age or medical condition  Airway Management Planned: Oral ETT  Additional Equipment:   Intra-op Plan:   Post-operative Plan: Extubation in OR  Informed Consent: I have reviewed the patients History and Physical, chart, labs and discussed the procedure including the risks, benefits and alternatives for the proposed anesthesia with the patient or authorized representative who has indicated his/her understanding and acceptance.   Dental advisory given  Plan Discussed with: CRNA, Anesthesiologist and Surgeon  Anesthesia Plan Comments:         Anesthesia Quick Evaluation

## 2017-06-02 NOTE — H&P (Signed)
Subjective: The patient is a 66 year old black female who has complained of back and leg pain consistent with neurogenic claudication.  She has failed medical management and was worked up with a lumbar MRI and lumbar x-rays.  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  I discussed the various treatment options with the patient including surgery.  She has weighed the risks, benefits, and alternatives of surgery and decided proceed with an L3-4 and L4-5 decompression, instrumentation, and fusion.  Past Medical History:  Diagnosis Date  . Arthritis   . Asthma    exercise induced  . Cancer ([place])    left breast  . Headache(784.0)    migraines  . Hypertension   . Obesity   . Shortness of breath    on exertion    Past Surgical History:  Procedure Laterality Date  . ABDOMINAL HYSTERECTOMY    . ABDOMINAL MASS RESECTION    . BREAST LUMPECTOMY Left 2007   malignant  . BREAST SURGERY     lumpectomy  . LIPOMA EXCISION     back area  . PARATHYROIDECTOMY     times 2  . TONSILLECTOMY      Allergies  Allergen Reactions  . Cranberry Other (See Comments)    Gives her migraines  . Latex Hives and Itching  . Penicillins Hives and Rash    Has patient had a PCN reaction causing immediate rash, facial/tongue/throat swelling, SOB or lightheadedness with hypotension: Yes Has patient had a PCN reaction causing severe rash involving mucus membranes or skin necrosis: Yes Has patient had a PCN reaction that required hospitalization: No Has patient had a PCN reaction occurring within the last 10 years: No If all of the above answers are "NO", then may proceed with Cephalosporin use.   Marland Kitchen Pineapple Other (See Comments)    Gives her migraines  . Shrimp [Shellfish Allergy] Hives    She can eat them but gets hives if she touches them when they're raw  . Sulfa Antibiotics Hives and Rash  . Tomato Other (See Comments)    Gives her migraines  . Iodinated Diagnostic Agents  Itching    Pre-medicate with Benadryl 50mg  PO    Social History   Tobacco Use  . Smoking status: Never Smoker  . Smokeless tobacco: Never Used  Substance Use Topics  . Alcohol use: Yes    Comment: rare social     Family History  Problem Relation Age of Onset  . Hypertension Mother   . Hyperlipidemia Mother   . Diabetes Father   . Diabetes Sister   . Diabetes Brother   . Asthma Maternal Grandmother   . Heart attack Maternal Grandfather   . Asthma Paternal Grandmother   . Hypertension Paternal Grandmother   . Diabetes Paternal Grandmother   . Asthma Paternal Grandfather   . Hypertension Paternal Grandfather    Prior to Admission medications   Medication Sig Start Date End Date Taking? Authorizing Provider  Ascorbic Acid (VITAMIN C) 1000 MG tablet Take 2,000 mg by mouth 2 (two) times daily.    Yes [provider]  BEE POLLEN PO Take 3 tablets by mouth daily.   Yes [provider]  Cholecalciferol (VITAMIN D) 2000 units CAPS Take 8,000 Units by mouth daily.   Yes [provider]  Cyanocobalamin (B-12) 1000 MCG CAPS Take 1,000 mcg by mouth daily.   Yes [provider]  diclofenac (VOLTAREN) 75 MG EC tablet TAKE 1 TABLET BY DAILY AS NEEDED FOR  PAIN WITH FOOD OR MILK 05/17/17  Yes [provider]  ibuprofen (ADVIL,MOTRIN) 200 MG tablet Take 800 mg by mouth every 8 (eight) hours as needed for mild pain or moderate pain.   Yes [provider]  Magnesium Oxide 500 MG CAPS Take 1,000 mg by mouth daily.   Yes [provider]  Omega-3 Fatty Acids (FISH OIL) 1000 MG CAPS Take 1,000 mg by mouth 3 (three) times daily.    Yes [provider]  Potassium (POTASSIMIN PO) Take 50 mg by mouth daily.   Yes [provider]  Probiotic Product (ACIDOPHILUS/BIFIDUS PO) Take 3 tablets by mouth daily.   Yes [provider]  valsartan-hydrochlorothiazide (DIOVAN-HCT) 160-12.5 MG per tablet Take 1 tablet by mouth daily.     Yes [provider]     Review of Systems  Positive ROS: As above  All other systems have been reviewed and were otherwise negative with the exception of those mentioned in the HPI and as above.  Objective: Vital signs in last 24 hours: Temp:  [97.9 F (36.6 C)] 97.9 F (36.6 C) (11/12 0621) Pulse Rate:  [67] 67 (11/12 0621) Resp:  [19] 19 (11/12 0621) BP: (175)/(96) 175/96 (11/12 0621) SpO2:  [100 %] 100 % (11/12 0621) Weight:  [104.3 kg (230 lb)] 104.3 kg (230 lb) (11/12 1601)  General Appearance: Alert Head: Normocephalic, without obvious abnormality, atraumatic Eyes: PERRL, conjunctiva/corneas clear, EOM's intact,    Ears: Normal  Throat: Normal  Neck: Supple, Back: unremarkable Lungs: Clear to auscultation bilaterally, respirations unlabored Heart: Regular rate and rhythm, no murmur, rub or gallop Abdomen: Soft, non-tender Extremities: Extremities normal, atraumatic, no cyanosis or edema Skin: unremarkable  NEUROLOGIC:   Mental status: alert and oriented,Motor Exam - grossly normal Sensory Exam - grossly normal Reflexes:  Coordination - grossly normal Gait - grossly normal Balance - grossly normal Cranial Nerves: I: smell Not tested  II: visual acuity  OS: Normal  OD: Normal   II: visual fields Full to confrontation  II: pupils Equal, round, reactive to light  III,VII: ptosis None  III,IV,VI: extraocular muscles  Full ROM  V: mastication Normal  V: facial light touch sensation  Normal  V,VII: corneal reflex  Present  VII: facial muscle function - upper  Normal  VII: facial muscle function - lower Normal  VIII: hearing Not tested  IX: soft palate elevation  Normal  IX,X: gag reflex Present  XI: trapezius strength  5/5  XI: sternocleidomastoid strength 5/5  XI: neck flexion strength  5/5  XII: tongue strength  Normal    Data Review Lab Results  Component Value Date   WBC 5.2 05/28/2017   HGB 11.7 (L) 05/28/2017   HCT 36.3 05/28/2017    MCV 91.9 05/28/2017   PLT 198 05/28/2017   Lab Results  Component Value Date   NA 142 05/28/2017   K 3.5 05/28/2017   CL 107 05/28/2017   CO2 27 05/28/2017   BUN 15 05/28/2017   CREATININE 1.03 (H) 05/28/2017   GLUCOSE 92 05/28/2017   No results found for: INR, PROTIME  Assessment/Plan: L3-4 and L4-5 spinal listhesis, spinal stenosis, lumbago, lumbar radiculopathy, neurogenic claudication: I have discussed the situation with the patient.  I have reviewed her imaging studies with her.  We have discussed the various treatment options including surgery.  I have described the surgical treatment option of an L3-4 and L4-5 decompression, instrumentation, and fusion.  I have given her a surgical pamphlet.  I have shown  her surgical models.  We had discussed the risks, benefits, alternatives, expected postoperative course, and likelihood of achieving our goals with surgery.  I have answered all the patient's, and her husband, questions.  She has decided to proceed with surgery.   Evanny Ellerbe D 06/02/2017 7:15 AM

## 2017-06-02 NOTE — Anesthesia Postprocedure Evaluation (Signed)
Anesthesia Post Note  Patient: Suzanne Pearson  Procedure(s) Performed: POSTERIOR LUMBAR INTERBODY FUSION, INTERBODY PROSTHESISI, POSTERIOR INSTRUMENTATION AND FUSION LUMBAR 3- LUMBAR 4, LUMBAR 4- LUMBAR 5 (N/A )     Patient location during evaluation: PACU Anesthesia Type: General Level of consciousness: awake and alert and oriented Pain management: pain level controlled Vital Signs Assessment: post-procedure vital signs reviewed and stable Respiratory status: spontaneous breathing, nonlabored ventilation, respiratory function stable and patient connected to nasal cannula oxygen Cardiovascular status: blood pressure returned to baseline and stable Postop Assessment: no apparent nausea or vomiting Anesthetic complications: no    Last Vitals:  Vitals:   06/02/17 1328 06/02/17 1330  BP:    Pulse: 77 72  Resp: (!) 9 (!) 9  Temp: 36.4 C   SpO2: 93% 96%    Last Pain:  Vitals:   06/02/17 1327  TempSrc:   PainSc: Asleep                 Jeffrey Graefe A.

## 2017-06-02 NOTE — Evaluation (Signed)
Physical Therapy Evaluation Patient Details Name: Suzanne Pearson MRN: [id number] DOB: 17-Oct-1950 Today's Date: 06/02/2017   History of Present Illness  Patient is a 66 yo female s/p POSTERIOR LUMBAR INTERBODY FUSION, INTERBODY PROSTHESISI, POSTERIOR INSTRUMENTATION AND FUSION LUMBAR 3- LUMBAR 4, LUMBAR 4- LUMBAR 5  Clinical Impression  Orders received for PT evaluation. Patient demonstrates deficits in functional mobility as indicated below. Will benefit from continued skilled PT to address deficits and maximize function. Will see as indicated and progress as tolerated.  At this time, patient significantly limited in activity tolerance due to "light headed and loopy" feeling. VSS. Nsg to monitor. Will follow for progress in am, may need to consider ST rehabilitation vs HHPT pending mobility progression.    Follow Up Recommendations Supervision/Assistance - 24 hour;Home health PT(Vs rehab pending progress in am)    Equipment Recommendations  Rolling walker with 5" wheels;3in1 (PT)    Recommendations for Other Services       Precautions / Restrictions Precautions Precautions: Back Precaution Booklet Issued: Yes (comment) Precaution Comments: verbally reviewed Required Braces or Orthoses: Spinal Brace Spinal Brace: Lumbar corset Restrictions Weight Bearing Restrictions: No      Mobility  Bed Mobility Overal bed mobility: Needs Assistance Bed Mobility: Rolling;Sidelying to Sit;Sit to Sidelying Rolling: Supervision Sidelying to sit: Min assist     Sit to sidelying: Min assist General bed mobility comments: Min assist for trunk elevation and rotation to EOB, Assist to elevate LE back to bed  Transfers Overall transfer level: Needs assistance Equipment used: 1 person hand held assist Transfers: Sit to/from Stand Sit to Stand: Min assist         General transfer comment: Min assist for stability, patient reports "loopy" feeling upon standing Increased time to  initiate movement  Ambulation/Gait Ambulation/Gait assistance: Min assist Ambulation Distance (Feet): 10 Feet Assistive device: 1 person hand held assist Gait Pattern/deviations: Step-to pattern;Shuffle Gait velocity: decreased   General Gait Details: patient very slow and limited with ambulation due to feeling "out of it"  Stairs            Wheelchair Mobility    Modified Rankin (Stroke Patients Only)       Balance Overall balance assessment: Needs assistance   Sitting balance-Leahy Scale: Fair     Standing balance support: During functional activity Standing balance-Leahy Scale: Poor Standing balance comment: needs assist at this time                             Pertinent Vitals/Pain Pain Assessment: Faces Faces Pain Scale: Hurts little more Pain Location: back Pain Descriptors / Indicators: Aching;Sore;Guarding;Grimacing;Operative site guarding Pain Intervention(s): Limited activity within patient's tolerance;Monitored during session;Repositioned    Home Living Family/patient expects to be discharged to:: Private residence(unsure if needed rehab or not ) Living Arrangements: Spouse/significant other Available Help at Discharge: Family Type of Home: House Home Access: Stairs to enter Entrance Stairs-Rails: None Technical brewer of Steps: 2 Home Layout: Two level;Bed/bath upstairs Home Equipment: None      Prior Function Level of Independence: Independent               Hand Dominance   Dominant Hand: Right    Extremity/Trunk Assessment   Upper Extremity Assessment Upper Extremity Assessment: Defer to OT evaluation    Lower Extremity Assessment Lower Extremity Assessment: Generalized weakness;Difficult to assess due to impaired cognition    Cervical / Trunk Assessment Cervical / Trunk Assessment: (s/p spinal surgery)  Communication   Communication: No difficulties  Cognition Arousal/Alertness: Awake/alert Behavior  During Therapy: WFL for tasks assessed/performed Overall Cognitive Status: Impaired/Different from baseline Area of Impairment: Problem solving;Following commands;Safety/judgement;Attention                   Current Attention Level: Selective   Following Commands: Follows one step commands inconsistently Safety/Judgement: Decreased awareness of safety   Problem Solving: Decreased initiation;Requires verbal cues;Requires tactile cues;Slow processing;Difficulty sequencing General Comments: Max multi modal cues throughout session, suspect medication related      General Comments      Exercises     Assessment/Plan    PT Assessment Patient needs continued PT services  PT Problem List Decreased strength;Decreased activity tolerance;Decreased balance;Decreased mobility;Decreased safety awareness;Pain       PT Treatment Interventions DME instruction;Gait training;Functional mobility training;Therapeutic activities;Therapeutic exercise;Patient/family education;Stair training;Neuromuscular re-education;Balance training    PT Goals (Current goals can be found in the Care Plan section)  Acute Rehab PT Goals Patient Stated Goal: to be independent PT Goal Formulation: With patient/family Time For Goal Achievement: 06/16/17 Potential to Achieve Goals: Good    Frequency Min 5X/week   Barriers to discharge Inaccessible home environment      Co-evaluation               AM-PAC PT "6 Clicks" Daily Activity  Outcome Measure Difficulty turning over in bed (including adjusting bedclothes, sheets and blankets)?: Unable Difficulty moving from lying on back to sitting on the side of the bed? : Unable Difficulty sitting down on and standing up from a chair with arms (e.g., wheelchair, bedside commode, etc,.)?: Unable Help needed moving to and from a bed to chair (including a wheelchair)?: A Little Help needed walking in hospital room?: A Little Help needed climbing 3-5 steps with  a railing? : A Lot 6 Click Score: 11    End of Session Equipment Utilized During Treatment: Back brace Activity Tolerance: Treatment limited secondary to medical complications (Comment)(feeling lightheaded) Patient left: in bed;with call bell/phone within reach;with SCD's reapplied;with family/visitor present Nurse Communication: Mobility status PT Visit Diagnosis: Unsteadiness on feet (R26.81);Difficulty in walking, not elsewhere classified (R26.2)    Time: 7622-6333 PT Time Calculation (min) (ACUTE ONLY): 23 min   Charges:   PT Evaluation $PT Eval Moderate Complexity: 1 Mod PT Treatments $Therapeutic Activity: 8-22 mins   PT G Codes:        Alben Deeds, PT DPT  Board Certified Neurologic Specialist [phone]   Duncan Dull 06/02/2017, 5:34 PM

## 2017-06-02 NOTE — Plan of Care (Signed)
  Progressing Activity: Ability to avoid complications of mobility impairment will improve 06/02/2017 1954 - Progressing by Charlena Cross, RN Ability to tolerate increased activity will improve 06/02/2017 1954 - Progressing by Charlena Cross, RN Will remain free from falls 06/02/2017 1954 - Progressing by Margot Chimes D, RN Bowel/Gastric: Gastrointestinal status for postoperative course will improve 06/02/2017 1954 - Progressing by Charlena Cross, RN Education: Ability to verbalize activity precautions or restrictions will improve 06/02/2017 1954 - Progressing by Margot Chimes D, RN Knowledge of the prescribed therapeutic regimen will improve 06/02/2017 1954 - Progressing by Charlena Cross, RN Understanding of discharge needs will improve 06/02/2017 1954 - Progressing by Charlena Cross, RN Physical Regulation: Ability to maintain clinical measurements within normal limits will improve 06/02/2017 1954 - Progressing by Charlena Cross, RN Postoperative complications will be avoided or minimized 06/02/2017 1954 - Progressing by Charlena Cross, RN Diagnostic test results will improve 06/02/2017 1954 - Progressing by Charlena Cross, RN Pain Management: Pain level will decrease 06/02/2017 1954 - Progressing by Charlena Cross, RN Skin Integrity: Signs of wound healing will improve 06/02/2017 1954 - Progressing by Margot Chimes D, RN Health Behavior/Discharge Planning: Identification of resources available to assist in meeting health care needs will improve 06/02/2017 1954 - Progressing by Charlena Cross, RN Bladder/Genitourinary: Urinary functional status for postoperative course will improve 06/02/2017 1954 - Progressing by Charlena Cross, RN Safety: Ability to remain free from injury will improve 06/02/2017 1954 - Progressing by Charlena Cross, RN

## 2017-06-03 LAB — CBC
HCT: 27.1 % — ABNORMAL LOW (ref 36.0–46.0)
Hemoglobin: 8.9 g/dL — ABNORMAL LOW (ref 12.0–15.0)
MCH: 30.7 pg (ref 26.0–34.0)
MCHC: 32.8 g/dL (ref 30.0–36.0)
MCV: 93.4 fL (ref 78.0–100.0)
PLATELETS: 184 10*3/uL (ref 150–400)
RBC: 2.9 MIL/uL — ABNORMAL LOW (ref 3.87–5.11)
RDW: 15.5 % (ref 11.5–15.5)
WBC: 11.8 10*3/uL — ABNORMAL HIGH (ref 4.0–10.5)

## 2017-06-03 LAB — BASIC METABOLIC PANEL
Anion gap: 8 (ref 5–15)
BUN: 17 mg/dL (ref 6–20)
CALCIUM: 8 mg/dL — AB (ref 8.9–10.3)
CO2: 23 mmol/L (ref 22–32)
CREATININE: 1.18 mg/dL — AB (ref 0.44–1.00)
Chloride: 106 mmol/L (ref 101–111)
GFR, EST AFRICAN AMERICAN: 56 mL/min — AB (ref >60)
GFR, EST NON AFRICAN AMERICAN: 48 mL/min — AB (ref >60)
Glucose, Bld: 162 mg/dL — ABNORMAL HIGH (ref 65–99)
Potassium: 3.8 mmol/L (ref 3.5–5.1)
SODIUM: 137 mmol/L (ref 135–145)

## 2017-06-03 NOTE — Evaluation (Signed)
Occupational Therapy Evaluation Patient Details Name: Suzanne Pearson MRN: [id number] DOB: [birth year] Today's Date: 06/03/2017    History of Present Illness Patient is a 66 yo female s/p POSTERIOR LUMBAR INTERBODY FUSION, INTERBODY PROSTHESISI, POSTERIOR INSTRUMENTATION AND FUSION LUMBAR 3- LUMBAR 4, LUMBAR 4- LUMBAR 5   Clinical Impression   This 66 y/o F presents with the above. At baseline Pt is independent with ADLs and functional mobility, reports she will return home with spouse assist. Pt completed room level functional mobility with overall minguard at RW level this session, requires Min-ModA for LB and toileting ADLs secondary to adhering to back precautions during task completion. Education provided on AE and compensatory techniques for functional task completion, however Pt would benefit from continued practice. Will continue to follow acutely, and recommend [place] services after d/c to maximize Pt's safety and independence with ADLs and mobility.     Follow Up Recommendations  Home health OT;Supervision/Assistance - 24 hour    Equipment Recommendations  3 in 1 bedside commode           Precautions / Restrictions Precautions Precautions: Back Precaution Booklet Issued: Yes (comment) Precaution Comments: verbally reviewed Required Braces or Orthoses: Spinal Brace Spinal Brace: Lumbar corset;Applied in sitting position Restrictions Weight Bearing Restrictions: No      Mobility Bed Mobility Overal bed mobility: Needs Assistance Bed Mobility: Rolling;Sidelying to Sit Rolling: Supervision Sidelying to sit: Supervision       General bed mobility comments: OOB with PT; Pt verbalizes log roll technique   Transfers Overall transfer level: Needs assistance Equipment used: Rolling walker (2 wheeled) Transfers: Sit to/from Stand Sit to Stand: Min assist;Min guard         General transfer comment: MinA initially from recliner; MinGuard from [place]  Overall balance assessment: Needs assistance   Sitting balance-Leahy Scale: Fair     Standing balance support: During functional activity Standing balance-Leahy Scale: Poor Standing balance comment: needs assist at this time; reliant on single UE support during grooming ADLs while standing at sink                            ADL either performed or assessed with clinical judgement   ADL Overall ADL's : Needs assistance/impaired Eating/Feeding: Set up;Sitting   Grooming: Wash/dry hands;Min guard;Standing   Upper Body Bathing: Minimal assistance;Sitting   Lower Body Bathing: Moderate assistance;Sit to/from stand;Cueing for back precautions   Upper Body Dressing : Min guard;Sitting   Lower Body Dressing: Moderate assistance;Sit to/from stand;Cueing for back precautions Lower Body Dressing Details (indicate cue type and reason): educated Pt on use of reacher and sock aide for completing LB dressing with Pt return demonstrating education  Toilet Transfer: Min guard;Ambulation;BSC;RW Toilet Transfer Details (indicate cue type and reason): BSC over toilet  Toileting- Clothing Manipulation and Hygiene: Moderate assistance;Sit to/from stand Toileting - Clothing Manipulation Details (indicate cue type and reason): Pt requires assist for peri-care; educated on AE for completing task    Tub/Shower Transfer Details (indicate cue type and reason): educated on completing bathing from sit<>stand level (vs standing) for increased safety; recommend Pt have spouse provide supervision initially during task completion for increased safety with Pt verbalizing understanding Functional mobility during ADLs: Min guard;Minimal assistance;Rolling walker General ADL Comments: Pt with decreased functional performance, is motivated to learn about AE and to regain independence with ADL completion  Pertinent Vitals/Pain Pain Assessment: Faces Faces Pain Scale: Hurts  little more Pain Location: back Pain Descriptors / Indicators: Aching;Sore;Guarding;Grimacing;Operative site guarding Pain Intervention(s): Monitored during session;Repositioned     Hand Dominance Right   Extremity/Trunk Assessment Upper Extremity Assessment Upper Extremity Assessment: Generalized weakness   Lower Extremity Assessment Lower Extremity Assessment: Defer to PT evaluation   Cervical / Trunk Assessment Cervical / Trunk Assessment: Other exceptions(s/p spinal surgery )   Communication Communication Communication: No difficulties   Cognition Arousal/Alertness: Awake/alert Behavior During Therapy: WFL for tasks assessed/performed Overall Cognitive Status: Impaired/Different from baseline Area of Impairment: Problem solving                       Following Commands: Follows one step commands with increased time       General Comments: Pt requires increased time and multimodal cues                    Home Living Family/patient expects to be discharged to:: Private residence Living Arrangements: Spouse/significant other Available Help at Discharge: Family Type of Home: House Home Access: Stairs to enter Technical brewer of Steps: 2 Entrance Stairs-Rails: None Home Layout: Two level;Bed/bath upstairs Alternate Level Stairs-Number of Steps: flight Alternate Level Stairs-Rails: Can reach both Bathroom Shower/Tub: Occupational psychologist: Standard     Home Equipment: None          Prior Functioning/Environment Level of Independence: Independent                 OT Problem List: Decreased strength;Impaired balance (sitting and/or standing);Decreased knowledge of precautions;Decreased knowledge of use of DME or AE;Pain      OT Treatment/Interventions: Self-care/ADL training;DME and/or AE instruction;Therapeutic activities;Balance training;Therapeutic exercise;Energy conservation;Patient/family education    OT  Goals(Current goals can be found in the care plan section) Acute Rehab OT Goals Patient Stated Goal: to be independent OT Goal Formulation: With patient Time For Goal Achievement: 06/17/17 Potential to Achieve Goals: Good  OT Frequency: Min 2X/week                             AM-PAC PT "6 Clicks" Daily Activity     Outcome Measure Help from another person eating meals?: None Help from another person taking care of personal grooming?: A Little Help from another person toileting, which includes using toliet, bedpan, or urinal?: A Lot Help from another person bathing (including washing, rinsing, drying)?: A Lot Help from another person to put on and taking off regular upper body clothing?: A Little Help from another person to put on and taking off regular lower body clothing?: A Lot 6 Click Score: 16   End of Session Equipment Utilized During Treatment: Back brace Nurse Communication: Mobility status  Activity Tolerance: Patient tolerated treatment well Patient left: in chair;with call bell/phone within reach  OT Visit Diagnosis: Other abnormalities of gait and mobility (R26.89);Unsteadiness on feet (R26.81);Muscle weakness (generalized) (M62.81)                Time: 1601-0932 OT Time Calculation (min): 30 min Charges:  OT General Charges $OT Visit: 1 Visit OT Evaluation $OT Eval Low Complexity: 1 Low OT Treatments $Self Care/Home Management : 8-22 mins G-Codes:     Lou Cal, OT Pager [phone] 06/03/2017   Raymondo Band 06/03/2017, 11:45 AM

## 2017-06-03 NOTE — Plan of Care (Signed)
  Completed/Met Activity: Ability to avoid complications of mobility impairment will improve 06/03/2017 1922 - Completed/Met by Pricilla Holm, Gisselle Galvis D, RN Ability to tolerate increased activity will improve 06/03/2017 1922 - Completed/Met by Pricilla Holm, Alano Blasco D, RN Will remain free from falls 06/03/2017 1922 - Completed/Met by Margot Chimes D, RN Bowel/Gastric: Gastrointestinal status for postoperative course will improve 06/03/2017 1922 - Completed/Met by Margot Chimes D, RN Pain Management: Pain level will decrease 06/03/2017 1922 - Completed/Met by Margot Chimes D, RN Bladder/Genitourinary: Urinary functional status for postoperative course will improve 06/03/2017 1922 - Completed/Met by Margot Chimes D, RN Safety: Ability to remain free from injury will improve 06/03/2017 1922 - Completed/Met by Pricilla Holm, Bernd Crom D, RN

## 2017-06-03 NOTE — Care Management Note (Signed)
Case Management Note  Patient Details  Name: Suzanne Pearson MRN: [id number] Date of Birth: 12-18-1950  Subjective/Objective:    S/p posterior lumbar interbody fusion                 Action/Plan: Discharge Planning: NCM spoke to pt and offered HH/list provided. Contacted [facility] and they cannot provide OT. [place] Liaison and they accepted referral for [facility]. Contacted [facility] for RW and 3n1 bedside commode for home. Husband at home to assist with care.   PCP Wenda Low MD    Expected Discharge Date:  06/04/2017               Expected Discharge Plan:  [place]  In-House Referral:  NA  Discharge planning Services  CM Consult  Post Acute Care Choice:  Home Health Choice offered to:  Patient  DME Arranged:  3-N-1, Walker rolling DME Agency:  [place] Arranged:  PT, OT [facility] Agency:  [place]  Status of Service:  Completed, signed off  If discussed at [place] of Stay Meetings, dates discussed:    Additional Comments:  Erenest Rasher, RN 06/03/2017, 4:58 PM

## 2017-06-03 NOTE — Progress Notes (Signed)
Physical Therapy Treatment Patient Details Name: Suzanne Pearson MRN: [id number] DOB: 29-Oct-1950 Today's Date: 06/03/2017    History of Present Illness Patient is a 66 yo female s/p POSTERIOR LUMBAR INTERBODY FUSION, INTERBODY PROSTHESISI, POSTERIOR INSTRUMENTATION AND FUSION LUMBAR 3- LUMBAR 4, LUMBAR 4- LUMBAR 5    PT Comments    Patient seen for mobility progression and further assessment s/p spinal surgery. Improved mobility today but does require UE suupport. Performed stair negotiation and reducated patient on precautions, mobility expectations, safety and car transfers. Current POC remains appropriate.   Follow Up Recommendations  Home health PT;Supervision/Assistance - 24 hour     Equipment Recommendations  Rolling walker with 5" wheels;3in1 (PT)    Recommendations for Other Services       Precautions / Restrictions Precautions Precautions: Back Precaution Booklet Issued: Yes (comment) Precaution Comments: verbally reviewed Required Braces or Orthoses: Spinal Brace Spinal Brace: Lumbar corset    Mobility  Bed Mobility Overal bed mobility: Needs Assistance Bed Mobility: Rolling;Sidelying to Sit Rolling: Supervision Sidelying to sit: Supervision       General bed mobility comments: increased time and effort to perform, cues for LE position  Transfers Overall transfer level: Needs assistance Equipment used: Rolling walker (2 wheeled) Transfers: Sit to/from Stand Sit to Stand: Min guard         General transfer comment: Min guard for safety  Ambulation/Gait Ambulation/Gait assistance: Min guard   Assistive device: Rolling walker (2 wheeled) Gait Pattern/deviations: Step-to pattern;Shuffle Gait velocity: decreased Gait velocity interpretation: Below normal speed for age/gender General Gait Details: patient slow with heavy reliance on RW. VCs for upright posture and positioning   Stairs Stairs: Yes   Stair Management: One rail  Left;Forwards;Step to pattern Number of Stairs: 6 General stair comments: assist for stability, VCs for technique and sequencing   Wheelchair Mobility    Modified Rankin (Stroke Patients Only)       Balance Overall balance assessment: Needs assistance   Sitting balance-Leahy Scale: Fair     Standing balance support: During functional activity Standing balance-Leahy Scale: Poor Standing balance comment: needs assist at this time                            Cognition Arousal/Alertness: Awake/alert Behavior During Therapy: WFL for tasks assessed/performed Overall Cognitive Status: Impaired/Different from baseline Area of Impairment: Problem solving                       Following Commands: Follows one step commands with increased time       General Comments: improved cognition today, but continues to show some impairments in processing and memory      Exercises      General Comments        Pertinent Vitals/Pain Pain Assessment: Faces Faces Pain Scale: Hurts even more Pain Location: back Pain Descriptors / Indicators: Aching;Sore;Guarding;Grimacing;Operative site guarding Pain Intervention(s): Monitored during session    Home Living                      Prior Function            PT Goals (current goals can now be found in the care plan section) Acute Rehab PT Goals Patient Stated Goal: to be independent PT Goal Formulation: With patient/family Time For Goal Achievement: 06/16/17 Potential to Achieve Goals: Good Progress towards PT goals: Progressing toward goals    Frequency  Min 5X/week      PT Plan Current plan remains appropriate    Co-evaluation              AM-PAC PT "6 Clicks" Daily Activity  Outcome Measure  Difficulty turning over in bed (including adjusting bedclothes, sheets and blankets)?: A Little Difficulty moving from lying on back to sitting on the side of the bed? : A Lot Difficulty  sitting down on and standing up from a chair with arms (e.g., wheelchair, bedside commode, etc,.)?: A Lot Help needed moving to and from a bed to chair (including a wheelchair)?: A Little Help needed walking in hospital room?: A Little Help needed climbing 3-5 steps with a railing? : A Lot 6 Click Score: 15    End of Session Equipment Utilized During Treatment: Back brace Activity Tolerance: Patient tolerated treatment well Patient left: in bed;with call bell/phone within reach;with chair alarm set Nurse Communication: Mobility status PT Visit Diagnosis: Unsteadiness on feet (R26.81);Difficulty in walking, not elsewhere classified (R26.2)     Time: 4709-2957 PT Time Calculation (min) (ACUTE ONLY): 20 min  Charges:  $Gait Training: 8-22 mins                    G Codes:       Alben Deeds, PT DPT  Board Certified Neurologic Specialist [place] 06/03/2017, 9:06 AM

## 2017-06-03 NOTE — Progress Notes (Signed)
Patient ID: Suzanne Pearson, female   DOB: 1951-04-29, 66 y.o.   MRN: [id number] Subjective: The patient is alert and pleasant.  She looks well.  Objective: Vital signs in last 24 hours: Temp:  [97.5 F (36.4 C)-98.8 F (37.1 C)] 98.8 F (37.1 C) (11/13 0407) Pulse Rate:  [61-88] 87 (11/13 0407) Resp:  [8-18] 18 (11/13 0407) BP: (107-119)/(57-81) 107/57 (11/13 0407) SpO2:  [93 %-100 %] 96 % (11/13 0407)  Intake/Output from previous day: 11/12 0701 - 11/13 0700 In: 1703 [I.V.:1703] Out: 2250 [Urine:1900; Blood:350] Intake/Output this shift: No intake/output data recorded.  Physical exam the patient is alert and oriented.  She is moving her lower extremities well.  Lab Results: No results for input(s): WBC, HGB, HCT, PLT in the last 72 hours. BMET No results for input(s): NA, K, CL, CO2, GLUCOSE, BUN, CREATININE, CALCIUM in the last 72 hours.  Studies/Results: Dg Lumbar Spine 2-3 Views  Result Date: 06/02/2017 CLINICAL DATA:  Posterior lumbar interbody fusion EXAM: LUMBAR SPINE - 2-3 VIEW; DG C-ARM 61-120 MIN COMPARISON:  None FLUOROSCOPY TIME:  36 seconds FINDINGS: Two intraoperative fluoroscopic spot images of the lumbar spine are provided. Posterior lumbar interbody fusion from L3 through L5 with bilateral pedicle screws at each level and interbody spacers at L3-4 and L4-5. IMPRESSION: Posterior lumbar interbody fusion L3-L5. Electronically Signed   By: Kathreen Devoid   On: 06/02/2017 12:32   Dg Lumbar Spine 1 View  Result Date: 06/02/2017 CLINICAL DATA:  Spondylolisthesis EXAM: LUMBAR SPINE - 1 VIEW COMPARISON:  Postmyelogram cervical spine CT Dec 17, 2016 FINDINGS: Cross-table lateral lumbar image time stamped 8:10:55 submitted. Metallic probe tip is posterior to the superior aspect of the L4 vertebral body. There is mild anterolisthesis of L3 on L4. There is disc space narrowing at L3-4, L4-5, L5-S1. No fracture. IMPRESSION: Metallic probe tip posterior to the superior  aspect of the L4 vertebral body. Mild anterolisthesis of L3 on L4. 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 Electronically Signed   By: Lowella Grip III M.D.   On: 06/02/2017 08:59   Dg C-arm 1-60 Min  Result Date: 06/02/2017 CLINICAL DATA:  Posterior lumbar interbody fusion EXAM: LUMBAR SPINE - 2-3 VIEW; DG C-ARM 61-120 MIN COMPARISON:  None FLUOROSCOPY TIME:  36 seconds FINDINGS: Two intraoperative fluoroscopic spot images of the lumbar spine are provided. Posterior lumbar interbody fusion from L3 through L5 with bilateral pedicle screws at each level and interbody spacers at L3-4 and L4-5. IMPRESSION: Posterior lumbar interbody fusion L3-L5. Electronically Signed   By: Kathreen Devoid   On: 06/02/2017 12:32    Assessment/Plan: Postop day #1: The patient is doing well.  We will mobilize her.  She may go home tomorrow.  LOS: 1 day     Dashayla Theissen D 06/03/2017, 7:39 AM

## 2017-06-04 MED ORDER — DOCUSATE SODIUM 100 MG PO CAPS: 100.0000 mg | ORAL_CAPSULE | Freq: Two times a day (BID) | ORAL | 0 refills | Status: AC

## 2017-06-04 MED ORDER — CYCLOBENZAPRINE HCL 10 MG PO TABS: 10.0000 mg | ORAL_TABLET | Freq: Three times a day (TID) | ORAL | 1 refills | Status: AC | PRN

## 2017-06-04 MED ORDER — OXYCODONE HCL 10 MG PO TABS: 10.0000 mg | ORAL_TABLET | ORAL | 0 refills | Status: AC | PRN

## 2017-06-04 NOTE — Progress Notes (Signed)
Physical Therapy Treatment Patient Details Name: Suzanne Pearson MRN: [id number] DOB: May 25, 1951 Today's Date: 06/04/2017    History of Present Illness Patient is a 66 yo female s/p POSTERIOR LUMBAR INTERBODY FUSION, INTERBODY PROSTHESISI, POSTERIOR INSTRUMENTATION AND FUSION LUMBAR 3- LUMBAR 4, LUMBAR 4- LUMBAR 5    PT Comments    Pt making good progress with mobility and tolerated ambulating a further distance this session. PT will continue to follow acutely to ensure a safe d/c home.  Follow Up Recommendations  Home health PT;Supervision/Assistance - 24 hour     Equipment Recommendations  Rolling walker with 5" wheels;3in1 (PT)    Recommendations for Other Services       Precautions / Restrictions Precautions Precautions: Back Precaution Comments: PT reviewed 3/3 back precautions with pt throughout session Required Braces or Orthoses: Spinal Brace Spinal Brace: Lumbar corset;Applied in sitting position Restrictions Weight Bearing Restrictions: No    Mobility  Bed Mobility               General bed mobility comments: pt OOB in recliner chair upon arrival  Transfers Overall transfer level: Needs assistance Equipment used: Rolling walker (2 wheeled) Transfers: Sit to/from Stand Sit to Stand: Supervision         General transfer comment: supervision for safety  Ambulation/Gait Ambulation/Gait assistance: Min guard Ambulation Distance (Feet): 400 Feet Assistive device: Rolling walker (2 wheeled) Gait Pattern/deviations: Step-through pattern;Decreased stride length Gait velocity: decreased Gait velocity interpretation: Below normal speed for age/gender General Gait Details: slow, cautious gait with no instability or LOB with use of RW; min guard for safety   Stairs            Wheelchair Mobility    Modified Rankin (Stroke Patients Only)       Balance Overall balance assessment: Needs assistance Sitting-balance support: Feet  supported Sitting balance-Leahy Scale: Fair     Standing balance support: During functional activity Standing balance-Leahy Scale: Fair Standing balance comment: Pt maintains static standing while advancing pants over hips with close guard for safety                             Cognition Arousal/Alertness: Awake/alert Behavior During Therapy: WFL for tasks assessed/performed Overall Cognitive Status: Within Functional Limits for tasks assessed                                        Exercises      General Comments        Pertinent Vitals/Pain Pain Assessment: 0-10 Pain Score: 5  Faces Pain Scale: Hurts a little bit Pain Location: back Pain Descriptors / Indicators: Aching;Sore Pain Intervention(s): Monitored during session;Repositioned    Home Living                      Prior Function            PT Goals (current goals can now be found in the care plan section) Acute Rehab PT Goals Patient Stated Goal: to be independent PT Goal Formulation: With patient/family Time For Goal Achievement: 06/16/17 Potential to Achieve Goals: Good Progress towards PT goals: Progressing toward goals    Frequency    Min 5X/week      PT Plan Current plan remains appropriate    Co-evaluation  AM-PAC PT "6 Clicks" Daily Activity  Outcome Measure  Difficulty turning over in bed (including adjusting bedclothes, sheets and blankets)?: A Little Difficulty moving from lying on back to sitting on the side of the bed? : A Little Difficulty sitting down on and standing up from a chair with arms (e.g., wheelchair, bedside commode, etc,.)?: A Little Help needed moving to and from a bed to chair (including a wheelchair)?: None Help needed walking in hospital room?: A Little Help needed climbing 3-5 steps with a railing? : A Little 6 Click Score: 19    End of Session Equipment Utilized During Treatment: Back brace Activity  Tolerance: Patient tolerated treatment well Patient left: in chair;with call bell/phone within reach Nurse Communication: Mobility status PT Visit Diagnosis: Unsteadiness on feet (R26.81);Difficulty in walking, not elsewhere classified (R26.2)     Time: 7793-9688 PT Time Calculation (min) (ACUTE ONLY): 16 min  Charges:  $Gait Training: 8-22 mins                    G Codes:       [place], [place] 06/04/2017, 8:53 AM

## 2017-06-04 NOTE — Discharge Summary (Signed)
Physician Discharge Summary  Patient ID: Suzanne Pearson MRN: [id number] DOB/AGE: 1951-05-02 66 y.o.  Admit date: 06/02/2017 Discharge date: 06/04/2017  Admission Diagnoses: L3-4 and L4-5 spondylolisthesis, spinal stenosis, lumbago, lumbar radiculopathy, neurogenic claudication  Discharge Diagnoses: The same    Active Problems:   Spondylolisthesis of lumbar region   Discharged Condition: good  Hospital Course: I performed an L3-4 and L4-5 decompression, instrumentation, and fusion on patient on 06/02/2017. The surgery went well.  The patient's postoperative course was unremarkable. On postoperative day #2 the patient requested discharge home. The patient, and her husband, were given written and oral discharge instructions. All their questions were answered. consults: Physical therapy  Significant Diagnostic Studies:none treatments: L3-4 and L4-5 decompression, instrumentation and fusion  Discharge Exam: Blood pressure 100/62, pulse 88, temperature 98.6 F (37 C), resp. rate 16, height 5\' 5"  (1.651 m), weight 104.3 kg (230 lb), SpO2 100 %. the patient is alert and pleasant. She looks well. Her strength is grossly normal in the lower extremities.  Disposit Call MD for:  difficulty breathing, headache or visual disturbances   Complete by:  As directed    Call MD for:  extreme fatigue   Complete by:  As directed    Call MD for:  hives   Complete by:  As directed    Call MD for:  persistant dizziness or light-headedness   Complete by:  As directed    Call MD for:  persistant nausea and vomiting   Complete by:  As directed    Call MD for:  redness, tenderness, or signs of infection (pain, swelling, redness, odor or green/yellow discharge around incision site)   Complete by:  As directed    Call MD for:  severe uncontrolled pain   Complete by:  As directed    Call MD for:  temperature >100.4   Complete by:  As directed    Diet - low sodium heart healthy   Complete by:  As  directed    Discharge instructions   Complete by:  As directed    Call [phone] for a followup appointment. Take a stool softener while you are using pain medications.   Driving Restrictions   Complete by:  As directed    Do not drive for 2 weeks.   Increase activity slowly   Complete by:  As directed    Lifting restrictions   Complete by:  As directed    Do not lift more than 5 pounds. No excessive bending or twisting.   May shower / Bathe   Complete by:  As directed    He may shower after the pain she is removed 3 days after surgery. Leave the incision alone.   Remove dressing in 24 hours   Complete by:  As directed      Allergies as of 06/04/2017      Reactions   Cranberry Other (See Comments)   Gives her migraines   Latex Hives, Itching   Penicillins Hives, Rash   Has patient had a PCN reaction causing immediate rash, facial/tongue/throat swelling, SOB or lightheadedness with hypotension: Yes Has patient had a PCN reaction causing severe rash involving mucus membranes or skin necrosis: Yes Has patient had a PCN reaction that required hospitalization: No Has patient had a PCN reaction occurring within the last 10 years: No If all of the above answers are "NO", then may proceed with Cephalosporin use.   Pineapple Other (See Comments)   Gives her migraines   Shrimp [shellfish Allergy]  Hives   She can eat them but gets hives if she touches them when they're raw   Sulfa Antibiotics Hives, Rash   Tomato Other (See Comments)   Gives her migraines   Iodinated Diagnostic Agents Itching   Pre-medicate with Benadryl 50mg  PO      Medication List    STOP taking these medications   diclofenac 75 MG EC tablet Commonly known as:  VOLTAREN   ibuprofen 200 MG tablet Commonly known as:  ADVIL,MOTRIN     TAKE these medications   ACIDOPHILUS/BIFIDUS PO Take 3 tablets by mouth daily.   B-12 1000 MCG Caps Take 1,000 mcg by mouth daily.   BEE POLLEN PO Take 3 tablets by  mouth daily.   cyclobenzaprine 10 MG tablet Commonly known as:  FLEXERIL Take 1 tablet (10 mg total) 3 (three) times daily as needed by mouth for muscle spasms.   docusate sodium 100 MG capsule Commonly known as:  COLACE Take 1 capsule (100 mg total) 2 (two) times daily by mouth.   Fish Oil 1000 MG Caps Take 1,000 mg by mouth 3 (three) times daily.   Magnesium Oxide 500 MG Caps Take 1,000 mg by mouth daily.   Oxycodone HCl 10 MG Tabs Take 1 tablet (10 mg total) every 4 (four) hours as needed by mouth for severe pain ((score 7 to 10)).   POTASSIMIN PO Take 50 mg by mouth daily.   valsartan-hydrochlorothiazide 160-12.5 MG tablet Commonly known as:  DIOVAN-HCT Take 1 tablet by mouth daily.   vitamin C 1000 MG tablet Take 2,000 mg by mouth 2 (two) times daily.   Vitamin D 2000 units Caps Take 8,000 Units by mouth daily.            Durable Medical Equipment  (From admission, onward)        Start     Ordered   06/03/17 1251  For home use only DME Walker rolling  Once    Question:  Patient needs a walker to treat with the following condition  Answer:  Status post spinal surgery   06/03/17 1250   06/03/17 1250  For home use only DME 3 n 1  Once     06/03/17 1250     Follow-up Information    [facility] Follow [facility].   Specialty:  Home Health Services Why:  Home Health Physical Therapy and Occupational Therapy-agency will call to arrange iniital visit Contact information: [address] [place] [phone]           Signed: Ophelia Charter 06/04/2017, 5:16 PM

## 2017-06-04 NOTE — Discharge Instructions (Signed)

## 2017-06-04 NOTE — Progress Notes (Signed)
Pt doing well. Pt and husband given D/C instructions with Rx's, verbal understanding was provided. Pt's incision is clean and dry with no sign of infection. Pt's IV was removed prior to D/C. Pt received RW and 3-n-1 from [facility] prior to D/C. Pt's Home Health was arranged by CM. Pt D/C'd home via wheelchair @ 1820 per MD order. Pt is stable @ D/C and has no other needs this time. Holli Humbles, RN

## 2017-06-04 NOTE — Progress Notes (Signed)
Occupational Therapy Treatment Patient Details Name: Suzanne Pearson MRN: [id number] DOB: 1951-03-05 Today's Date: 06/04/2017    History of present illness Patient is a 66 yo female s/p POSTERIOR LUMBAR INTERBODY FUSION, INTERBODY PROSTHESISI, POSTERIOR INSTRUMENTATION AND FUSION LUMBAR 3- LUMBAR 4, LUMBAR 4- LUMBAR 5   OT comments  Session focus on additional education/review of AE and compensatory techniques for completing ADLs while adhering to back precautions. Pt completed LB dressing with min verbal cues for use of AE, completed simulated walk-in shower transfer at [facility] level with MinGuard assist. Questions answered throughout. Feel Pt will safely return home with available assist from spouse PRN. No further acute OT needs identified at this time. Will sign off.   Follow Up Recommendations  Home health OT;Supervision/Assistance - 24 hour    Equipment Recommendations  3 in 1 bedside commode          Precautions / Restrictions Precautions Precautions: Back Precaution Comments: verbally reviewed, Pt recalls 3/3 precautions Required Braces or Orthoses: Spinal Brace Spinal Brace: Lumbar corset;Applied in sitting position Restrictions Weight Bearing Restrictions: No       Mobility Bed Mobility               General bed mobility comments: OOB upon arrival   Transfers Overall transfer level: Needs assistance Equipment used: Rolling walker (2 wheeled) Transfers: Sit to/from Stand Sit to Stand: Min guard         General transfer comment: min guard for safety; verbal cues for hand placement     Balance     Sitting balance-Leahy Scale: Fair     Standing balance support: During functional activity Standing balance-Leahy Scale: Fair Standing balance comment: Pt maintains static standing while advancing pants over hips with close guard for safety                            ADL either performed or assessed with clinical judgement   ADL Overall  ADL's : Needs assistance/impaired                     Lower Body Dressing: Minimal assistance;Sit to/from stand;Adhering to back precautions;With adaptive equipment Lower Body Dressing Details (indicate cue type and reason): Pt utilizing reacher to don/doff pants with min verbal cues for technique and safety          Tub/ Shower Transfer: Walk-in shower;Min Engineer, drilling Details (indicate cue type and reason): simulated in room with Pt return demonstrating with minguard for safety  Functional mobility during ADLs: Min guard;Rolling walker General ADL Comments: session focus on additional review and education of back precautions, techniques/AE for ADL completion while adhering to precautions                       Cognition Arousal/Alertness: Awake/alert Behavior During Therapy: WFL for tasks assessed/performed Overall Cognitive Status: Within Functional Limits for tasks assessed                                                            Pertinent Vitals/ Pain       Pain Assessment: Faces Faces Pain Scale: Hurts a little bit Pain Location: back Pain Descriptors / Indicators: Aching;Sore Pain Intervention(s): Monitored during session  Frequency  Min 2X/week        Progress Toward Goals  OT Goals(current goals can now be found in the care plan section)  Progress towards OT goals: Goals met/education completed, patient discharged from OT  Acute Rehab OT Goals Patient Stated Goal: to be independent  Plan All goals met and education completed, patient discharged from OT services                     AM-PAC PT "6 Clicks" Daily Activity     Outcome Measure   Help from another person eating meals?: None Help from another person taking care of personal grooming?: A Little Help from another person toileting, which  includes using toliet, bedpan, or urinal?: A Lot Help from another person bathing (including washing, rinsing, drying)?: A Lot Help from another person to put on and taking off regular upper body clothing?: A Little Help from another person to put on and taking off regular lower body clothing?: A Lot 6 Click Score: 16    End of Session Equipment Utilized During Treatment: Back brace;Rolling walker  OT Visit Diagnosis: Other abnormalities of gait and mobility (R26.89);Unsteadiness on feet (R26.81);Muscle weakness (generalized) (M62.81)   Activity Tolerance Patient tolerated treatment well   Patient Left in chair;with call bell/phone within reach   Nurse Communication Mobility status        Time: 6283-6629 OT Time Calculation (min): 20 min  Charges: OT General Charges $OT Visit: 1 Visit OT Treatments $Self Care/Home Management : 8-22 mins  Lou Cal, OT Pager [phone] 06/04/2017   Raymondo Band 06/04/2017, 8:46 AM

## 2017-06-05 MED FILL — Sodium Chloride IV Soln 0.9%: INTRAVENOUS | Qty: 1000 | Status: AC

## 2017-06-05 MED FILL — Heparin Sodium (Porcine) Inj 1000 Unit/ML: INTRAMUSCULAR | Qty: 30 | Status: AC

## 2017-10-20 ENCOUNTER — Other Ambulatory Visit: Payer: Self-pay | Admitting: Internal Medicine

## 2017-10-20 DIAGNOSIS — Z1231 Encounter for screening mammogram for malignant neoplasm of breast: Secondary | ICD-10-CM

## 2017-12-01 ENCOUNTER — Ambulatory Visit
Admission: RE | Admit: 2017-12-01 | Discharge: 2017-12-01 | Disposition: A | Discharge status: Home / Self Care | Payer: BC Managed Care – PPO | Source: Ambulatory Visit | Attending: Internal Medicine | Admitting: Internal Medicine

## 2017-12-01 DIAGNOSIS — Z1231 Encounter for screening mammogram for malignant neoplasm of breast: Secondary | ICD-10-CM

## 2018-06-01 DIAGNOSIS — I10 Essential (primary) hypertension: Secondary | ICD-10-CM | POA: Diagnosis not present

## 2018-06-01 DIAGNOSIS — M4306 Spondylolysis, lumbar region: Secondary | ICD-10-CM | POA: Diagnosis not present

## 2018-06-01 DIAGNOSIS — C50912 Malignant neoplasm of unspecified site of left female breast: Secondary | ICD-10-CM | POA: Diagnosis not present

## 2018-07-24 DIAGNOSIS — H01022 Squamous blepharitis right lower eyelid: Secondary | ICD-10-CM | POA: Diagnosis not present

## 2018-07-24 DIAGNOSIS — H40013 Open angle with borderline findings, low risk, bilateral: Secondary | ICD-10-CM | POA: Diagnosis not present

## 2018-07-24 DIAGNOSIS — H01021 Squamous blepharitis right upper eyelid: Secondary | ICD-10-CM | POA: Diagnosis not present

## 2018-07-24 DIAGNOSIS — H2513 Age-related nuclear cataract, bilateral: Secondary | ICD-10-CM | POA: Diagnosis not present

## 2018-08-18 DIAGNOSIS — M4150 Other secondary scoliosis, site unspecified: Secondary | ICD-10-CM | POA: Diagnosis not present

## 2018-08-18 DIAGNOSIS — M4316 Spondylolisthesis, lumbar region: Secondary | ICD-10-CM | POA: Diagnosis not present

## 2018-08-18 DIAGNOSIS — G5622 Lesion of ulnar nerve, left upper limb: Secondary | ICD-10-CM | POA: Diagnosis not present

## 2018-08-18 DIAGNOSIS — I10 Essential (primary) hypertension: Secondary | ICD-10-CM | POA: Diagnosis not present

## 2018-11-03 DIAGNOSIS — M5416 Radiculopathy, lumbar region: Secondary | ICD-10-CM | POA: Diagnosis not present

## 2018-11-03 DIAGNOSIS — Z981 Arthrodesis status: Secondary | ICD-10-CM | POA: Diagnosis not present

## 2018-11-09 ENCOUNTER — Other Ambulatory Visit: Payer: Self-pay | Admitting: Internal Medicine

## 2018-11-09 DIAGNOSIS — Z1231 Encounter for screening mammogram for malignant neoplasm of breast: Secondary | ICD-10-CM

## 2018-11-12 DIAGNOSIS — M5416 Radiculopathy, lumbar region: Secondary | ICD-10-CM | POA: Diagnosis not present

## 2018-11-12 DIAGNOSIS — M545 Low back pain: Secondary | ICD-10-CM | POA: Diagnosis not present

## 2019-01-04 ENCOUNTER — Ambulatory Visit: Payer: BC Managed Care – PPO

## 2019-01-13 ENCOUNTER — Ambulatory Visit
Admission: RE | Admit: 2019-01-13 | Discharge: 2019-01-13 | Disposition: A | Discharge status: Home / Self Care | Payer: BC Managed Care – PPO | Source: Ambulatory Visit | Attending: Internal Medicine | Admitting: Internal Medicine

## 2019-01-13 ENCOUNTER — Other Ambulatory Visit: Payer: Self-pay

## 2019-01-13 DIAGNOSIS — Z1231 Encounter for screening mammogram for malignant neoplasm of breast: Secondary | ICD-10-CM

## 2019-03-23 DIAGNOSIS — Z23 Encounter for immunization: Secondary | ICD-10-CM | POA: Diagnosis not present

## 2019-03-23 DIAGNOSIS — Z0000 Encounter for general adult medical examination without abnormal findings: Secondary | ICD-10-CM | POA: Diagnosis not present

## 2019-03-23 DIAGNOSIS — Z1389 Encounter for screening for other disorder: Secondary | ICD-10-CM | POA: Diagnosis not present

## 2019-03-23 DIAGNOSIS — E213 Hyperparathyroidism, unspecified: Secondary | ICD-10-CM | POA: Diagnosis not present

## 2019-03-23 DIAGNOSIS — I10 Essential (primary) hypertension: Secondary | ICD-10-CM | POA: Diagnosis not present

## 2019-03-23 DIAGNOSIS — R7309 Other abnormal glucose: Secondary | ICD-10-CM | POA: Diagnosis not present

## 2019-03-23 DIAGNOSIS — Z13220 Encounter for screening for lipoid disorders: Secondary | ICD-10-CM | POA: Diagnosis not present

## 2019-03-24 DIAGNOSIS — Z01419 Encounter for gynecological examination (general) (routine) without abnormal findings: Secondary | ICD-10-CM | POA: Diagnosis not present

## 2019-03-26 ENCOUNTER — Other Ambulatory Visit: Payer: Self-pay | Admitting: Obstetrics

## 2019-03-26 DIAGNOSIS — R5381 Other malaise: Secondary | ICD-10-CM

## 2019-05-19 ENCOUNTER — Other Ambulatory Visit: Payer: Self-pay

## 2019-05-19 ENCOUNTER — Ambulatory Visit
Admission: RE | Admit: 2019-05-19 | Discharge: 2019-05-19 | Disposition: A | Discharge status: Home / Self Care | Payer: Medicare Other | Source: Ambulatory Visit | Attending: Obstetrics | Admitting: Obstetrics

## 2019-05-19 DIAGNOSIS — Z780 Asymptomatic menopausal state: Secondary | ICD-10-CM | POA: Diagnosis not present

## 2019-05-19 DIAGNOSIS — M85851 Other specified disorders of bone density and structure, right thigh: Secondary | ICD-10-CM | POA: Diagnosis not present

## 2019-05-19 DIAGNOSIS — R5381 Other malaise: Secondary | ICD-10-CM

## 2019-07-27 DIAGNOSIS — H0102B Squamous blepharitis left eye, upper and lower eyelids: Secondary | ICD-10-CM | POA: Diagnosis not present

## 2019-07-27 DIAGNOSIS — H2513 Age-related nuclear cataract, bilateral: Secondary | ICD-10-CM | POA: Diagnosis not present

## 2019-07-27 DIAGNOSIS — H40013 Open angle with borderline findings, low risk, bilateral: Secondary | ICD-10-CM | POA: Diagnosis not present

## 2019-07-27 DIAGNOSIS — H0102A Squamous blepharitis right eye, upper and lower eyelids: Secondary | ICD-10-CM | POA: Diagnosis not present

## 2019-08-17 DIAGNOSIS — M4150 Other secondary scoliosis, site unspecified: Secondary | ICD-10-CM | POA: Diagnosis not present

## 2019-08-17 DIAGNOSIS — Z6841 Body Mass Index (BMI) 40.0 and over, adult: Secondary | ICD-10-CM | POA: Diagnosis not present

## 2019-08-17 DIAGNOSIS — M4180 Other forms of scoliosis, site unspecified: Secondary | ICD-10-CM | POA: Diagnosis not present

## 2019-08-18 ENCOUNTER — Ambulatory Visit: Payer: Medicare Other

## 2019-08-22 ENCOUNTER — Ambulatory Visit: Payer: Medicare Other

## 2019-08-27 ENCOUNTER — Ambulatory Visit: Payer: Medicare Other

## 2019-08-27 ENCOUNTER — Ambulatory Visit: Payer: Federal, State, Local not specified - PPO | Attending: Internal Medicine

## 2019-08-27 DIAGNOSIS — Z23 Encounter for immunization: Secondary | ICD-10-CM

## 2019-08-27 NOTE — Progress Notes (Signed)
   Covid-19 Vaccination Clinic  Name:  Suzanne Pearson    MRN: [id number] DOB: Aug 03, 1950  08/27/2019  [name] was observed post Covid-19 immunization for 30 minutes based on pre-vaccination screening without incidence. She was provided with Vaccine Information Sheet and instruction to access the V-Safe system.   [name] was instructed to call 911 with any severe reactions post vaccine: Marland Kitchen Difficulty breathing  . Swelling of your face and throat  . A fast heartbeat  . A bad rash all over your body  . Dizziness and weakness    Immunizations Administered    Name Date Dose VIS Date Route   Pfizer COVID-19 Vaccine 08/27/2019  8:32 AM 0.3 mL 07/02/2019 Intramuscular   Manufacturer: [place]   Lot: [id number]   [place]: [id number]

## 2019-08-29 ENCOUNTER — Ambulatory Visit: Payer: Medicare Other

## 2019-09-21 ENCOUNTER — Ambulatory Visit: Payer: Federal, State, Local not specified - PPO | Attending: Internal Medicine

## 2019-09-21 DIAGNOSIS — Z23 Encounter for immunization: Secondary | ICD-10-CM | POA: Insufficient documentation

## 2019-09-21 NOTE — Progress Notes (Addendum)
   Covid-19 Vaccination Clinic  Name:  Suzanne Pearson    MRN: [id number] DOB: 1951-06-09  09/21/2019  Suzanne Pearson was observed post Covid-19 immunization for 30 minutes based on pre-vaccination screening without incident. She was provided with Vaccine Information Sheet and instruction to access the V-Safe system.   Suzanne Pearson was instructed to call 911 with any severe reactions post vaccine: Marland Kitchen Difficulty breathing  . Swelling of face and throat  . A fast heartbeat  . A bad rash all over body  . Dizziness and weakness   Immunizations Administered    Name Date Dose VIS Date Route   Pfizer COVID-19 Vaccine 09/21/2019  8:50 AM 0.3 mL 07/02/2019 Intramuscular   Manufacturer: [place]   Lot: [id number]   [place]: [id number]

## 2019-09-27 DIAGNOSIS — J309 Allergic rhinitis, unspecified: Secondary | ICD-10-CM | POA: Diagnosis not present

## 2019-09-27 DIAGNOSIS — I10 Essential (primary) hypertension: Secondary | ICD-10-CM | POA: Diagnosis not present

## 2019-09-27 DIAGNOSIS — E559 Vitamin D deficiency, unspecified: Secondary | ICD-10-CM | POA: Diagnosis not present

## 2019-09-27 DIAGNOSIS — Z853 Personal history of malignant neoplasm of breast: Secondary | ICD-10-CM | POA: Diagnosis not present

## 2019-09-27 DIAGNOSIS — R7303 Prediabetes: Secondary | ICD-10-CM | POA: Diagnosis not present

## 2019-11-30 ENCOUNTER — Other Ambulatory Visit: Payer: Self-pay | Admitting: Internal Medicine

## 2019-11-30 DIAGNOSIS — Z1231 Encounter for screening mammogram for malignant neoplasm of breast: Secondary | ICD-10-CM

## 2020-01-14 ENCOUNTER — Other Ambulatory Visit: Payer: Self-pay

## 2020-01-14 ENCOUNTER — Ambulatory Visit
Admission: RE | Admit: 2020-01-14 | Discharge: 2020-01-14 | Disposition: A | Discharge status: Home / Self Care | Payer: Federal, State, Local not specified - PPO | Source: Ambulatory Visit | Attending: Internal Medicine | Admitting: Internal Medicine

## 2020-01-14 DIAGNOSIS — Z1231 Encounter for screening mammogram for malignant neoplasm of breast: Secondary | ICD-10-CM | POA: Diagnosis not present

## 2020-02-01 DIAGNOSIS — M4180 Other forms of scoliosis, site unspecified: Secondary | ICD-10-CM | POA: Diagnosis not present

## 2020-02-01 DIAGNOSIS — M4150 Other secondary scoliosis, site unspecified: Secondary | ICD-10-CM | POA: Diagnosis not present

## 2020-02-01 DIAGNOSIS — M25552 Pain in left hip: Secondary | ICD-10-CM | POA: Diagnosis not present

## 2020-02-08 DIAGNOSIS — M5126 Other intervertebral disc displacement, lumbar region: Secondary | ICD-10-CM | POA: Diagnosis not present

## 2020-02-08 DIAGNOSIS — M48061 Spinal stenosis, lumbar region without neurogenic claudication: Secondary | ICD-10-CM | POA: Diagnosis not present

## 2020-02-08 DIAGNOSIS — M4180 Other forms of scoliosis, site unspecified: Secondary | ICD-10-CM | POA: Diagnosis not present

## 2020-02-29 DIAGNOSIS — M48062 Spinal stenosis, lumbar region with neurogenic claudication: Secondary | ICD-10-CM | POA: Diagnosis not present

## 2020-02-29 DIAGNOSIS — Z981 Arthrodesis status: Secondary | ICD-10-CM | POA: Diagnosis not present

## 2020-02-29 DIAGNOSIS — M25552 Pain in left hip: Secondary | ICD-10-CM | POA: Diagnosis not present

## 2020-02-29 DIAGNOSIS — M5416 Radiculopathy, lumbar region: Secondary | ICD-10-CM | POA: Diagnosis not present

## 2020-03-14 DIAGNOSIS — M5416 Radiculopathy, lumbar region: Secondary | ICD-10-CM | POA: Diagnosis not present

## 2020-03-14 DIAGNOSIS — Z6838 Body mass index (BMI) 38.0-38.9, adult: Secondary | ICD-10-CM | POA: Diagnosis not present

## 2020-03-14 DIAGNOSIS — M25552 Pain in left hip: Secondary | ICD-10-CM | POA: Diagnosis not present

## 2020-03-14 DIAGNOSIS — Z981 Arthrodesis status: Secondary | ICD-10-CM | POA: Diagnosis not present

## 2020-04-20 DIAGNOSIS — M25552 Pain in left hip: Secondary | ICD-10-CM | POA: Diagnosis not present

## 2020-04-20 DIAGNOSIS — M1612 Unilateral primary osteoarthritis, left hip: Secondary | ICD-10-CM | POA: Diagnosis not present

## 2020-05-02 DIAGNOSIS — M5416 Radiculopathy, lumbar region: Secondary | ICD-10-CM | POA: Diagnosis not present

## 2020-06-02 DIAGNOSIS — M5126 Other intervertebral disc displacement, lumbar region: Secondary | ICD-10-CM | POA: Diagnosis not present

## 2020-06-02 DIAGNOSIS — M5416 Radiculopathy, lumbar region: Secondary | ICD-10-CM | POA: Diagnosis not present

## 2020-06-06 ENCOUNTER — Other Ambulatory Visit: Payer: Self-pay | Admitting: Neurosurgery

## 2020-06-26 ENCOUNTER — Encounter (HOSPITAL_COMMUNITY): Admission: RE | Payer: Self-pay | Source: Home / Self Care

## 2020-06-26 ENCOUNTER — Ambulatory Visit (HOSPITAL_COMMUNITY): Admission: RE | Admit: 2020-06-26 | Payer: Medicare HMO | Source: Home / Self Care | Admitting: Neurosurgery

## 2020-06-26 SURGERY — LUMBAR LAMINECTOMY/DECOMPRESSION MICRODISCECTOMY 1 LEVEL
Anesthesia: General | Laterality: Left

## 2020-06-29 DIAGNOSIS — M545 Low back pain, unspecified: Secondary | ICD-10-CM | POA: Diagnosis not present

## 2020-06-29 DIAGNOSIS — M79605 Pain in left leg: Secondary | ICD-10-CM | POA: Diagnosis not present

## 2020-06-29 DIAGNOSIS — R269 Unspecified abnormalities of gait and mobility: Secondary | ICD-10-CM | POA: Diagnosis not present

## 2020-06-29 DIAGNOSIS — M5416 Radiculopathy, lumbar region: Secondary | ICD-10-CM | POA: Diagnosis not present

## 2020-07-04 DIAGNOSIS — M5416 Radiculopathy, lumbar region: Secondary | ICD-10-CM | POA: Diagnosis not present

## 2020-07-04 DIAGNOSIS — M545 Low back pain, unspecified: Secondary | ICD-10-CM | POA: Diagnosis not present

## 2020-07-04 DIAGNOSIS — R269 Unspecified abnormalities of gait and mobility: Secondary | ICD-10-CM | POA: Diagnosis not present

## 2020-07-04 DIAGNOSIS — M79605 Pain in left leg: Secondary | ICD-10-CM | POA: Diagnosis not present

## 2020-07-11 DIAGNOSIS — M5416 Radiculopathy, lumbar region: Secondary | ICD-10-CM | POA: Diagnosis not present

## 2020-07-11 DIAGNOSIS — M545 Low back pain, unspecified: Secondary | ICD-10-CM | POA: Diagnosis not present

## 2020-07-11 DIAGNOSIS — M79605 Pain in left leg: Secondary | ICD-10-CM | POA: Diagnosis not present

## 2020-07-11 DIAGNOSIS — R269 Unspecified abnormalities of gait and mobility: Secondary | ICD-10-CM | POA: Diagnosis not present

## 2020-07-19 DIAGNOSIS — R7309 Other abnormal glucose: Secondary | ICD-10-CM | POA: Diagnosis not present

## 2020-07-19 DIAGNOSIS — M4306 Spondylolysis, lumbar region: Secondary | ICD-10-CM | POA: Diagnosis not present

## 2020-07-19 DIAGNOSIS — E559 Vitamin D deficiency, unspecified: Secondary | ICD-10-CM | POA: Diagnosis not present

## 2020-07-19 DIAGNOSIS — Z853 Personal history of malignant neoplasm of breast: Secondary | ICD-10-CM | POA: Diagnosis not present

## 2020-07-19 DIAGNOSIS — Z6839 Body mass index (BMI) 39.0-39.9, adult: Secondary | ICD-10-CM | POA: Diagnosis not present

## 2020-07-19 DIAGNOSIS — Z0000 Encounter for general adult medical examination without abnormal findings: Secondary | ICD-10-CM | POA: Diagnosis not present

## 2020-07-19 DIAGNOSIS — E7800 Pure hypercholesterolemia, unspecified: Secondary | ICD-10-CM | POA: Diagnosis not present

## 2020-07-19 DIAGNOSIS — Z1389 Encounter for screening for other disorder: Secondary | ICD-10-CM | POA: Diagnosis not present

## 2020-07-19 DIAGNOSIS — I10 Essential (primary) hypertension: Secondary | ICD-10-CM | POA: Diagnosis not present

## 2020-07-28 DIAGNOSIS — M545 Low back pain, unspecified: Secondary | ICD-10-CM | POA: Diagnosis not present

## 2020-07-28 DIAGNOSIS — H2513 Age-related nuclear cataract, bilateral: Secondary | ICD-10-CM | POA: Diagnosis not present

## 2020-07-28 DIAGNOSIS — M79605 Pain in left leg: Secondary | ICD-10-CM | POA: Diagnosis not present

## 2020-07-28 DIAGNOSIS — H0102A Squamous blepharitis right eye, upper and lower eyelids: Secondary | ICD-10-CM | POA: Diagnosis not present

## 2020-07-28 DIAGNOSIS — R269 Unspecified abnormalities of gait and mobility: Secondary | ICD-10-CM | POA: Diagnosis not present

## 2020-07-28 DIAGNOSIS — M5416 Radiculopathy, lumbar region: Secondary | ICD-10-CM | POA: Diagnosis not present

## 2020-07-28 DIAGNOSIS — H40013 Open angle with borderline findings, low risk, bilateral: Secondary | ICD-10-CM | POA: Diagnosis not present

## 2020-07-28 DIAGNOSIS — H0102B Squamous blepharitis left eye, upper and lower eyelids: Secondary | ICD-10-CM | POA: Diagnosis not present

## 2020-08-01 DIAGNOSIS — M5416 Radiculopathy, lumbar region: Secondary | ICD-10-CM | POA: Diagnosis not present

## 2020-08-01 DIAGNOSIS — M79605 Pain in left leg: Secondary | ICD-10-CM | POA: Diagnosis not present

## 2020-08-01 DIAGNOSIS — M545 Low back pain, unspecified: Secondary | ICD-10-CM | POA: Diagnosis not present

## 2020-08-01 DIAGNOSIS — R269 Unspecified abnormalities of gait and mobility: Secondary | ICD-10-CM | POA: Diagnosis not present

## 2020-08-14 DIAGNOSIS — M79605 Pain in left leg: Secondary | ICD-10-CM | POA: Diagnosis not present

## 2020-08-14 DIAGNOSIS — R269 Unspecified abnormalities of gait and mobility: Secondary | ICD-10-CM | POA: Diagnosis not present

## 2020-08-14 DIAGNOSIS — M545 Low back pain, unspecified: Secondary | ICD-10-CM | POA: Diagnosis not present

## 2020-08-14 DIAGNOSIS — M5416 Radiculopathy, lumbar region: Secondary | ICD-10-CM | POA: Diagnosis not present

## 2020-08-21 DIAGNOSIS — R269 Unspecified abnormalities of gait and mobility: Secondary | ICD-10-CM | POA: Diagnosis not present

## 2020-08-21 DIAGNOSIS — M79605 Pain in left leg: Secondary | ICD-10-CM | POA: Diagnosis not present

## 2020-08-21 DIAGNOSIS — M545 Low back pain, unspecified: Secondary | ICD-10-CM | POA: Diagnosis not present

## 2020-08-21 DIAGNOSIS — M5416 Radiculopathy, lumbar region: Secondary | ICD-10-CM | POA: Diagnosis not present

## 2020-08-28 DIAGNOSIS — M79605 Pain in left leg: Secondary | ICD-10-CM | POA: Diagnosis not present

## 2020-08-28 DIAGNOSIS — M5416 Radiculopathy, lumbar region: Secondary | ICD-10-CM | POA: Diagnosis not present

## 2020-08-28 DIAGNOSIS — R269 Unspecified abnormalities of gait and mobility: Secondary | ICD-10-CM | POA: Diagnosis not present

## 2020-08-28 DIAGNOSIS — M545 Low back pain, unspecified: Secondary | ICD-10-CM | POA: Diagnosis not present

## 2020-09-07 DIAGNOSIS — R269 Unspecified abnormalities of gait and mobility: Secondary | ICD-10-CM | POA: Diagnosis not present

## 2020-09-07 DIAGNOSIS — M79605 Pain in left leg: Secondary | ICD-10-CM | POA: Diagnosis not present

## 2020-09-07 DIAGNOSIS — M5416 Radiculopathy, lumbar region: Secondary | ICD-10-CM | POA: Diagnosis not present

## 2020-09-07 DIAGNOSIS — M545 Low back pain, unspecified: Secondary | ICD-10-CM | POA: Diagnosis not present

## 2020-09-12 IMAGING — MG DIGITAL SCREENING BILAT W/ TOMO W/ CAD
8 series · 8 of 24 positions shown · non-contrast
Comparison: Previous exam(s).

CLINICAL DATA: Screening.

EXAM:
DIGITAL SCREENING BILATERAL MAMMOGRAM WITH TOMO AND CAD

[R CC synth-2D]
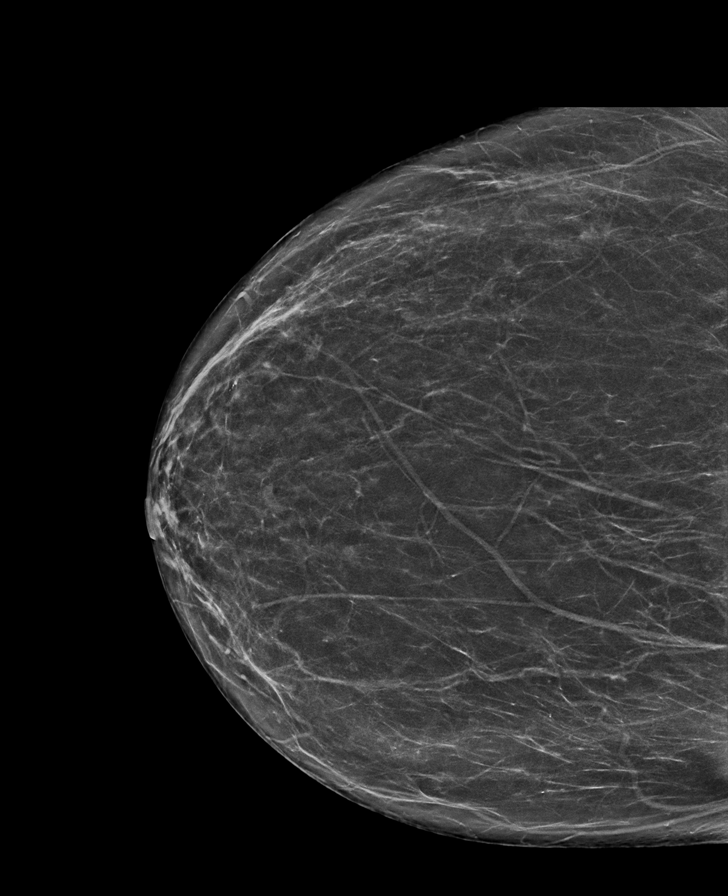

[L CC synth-2D]
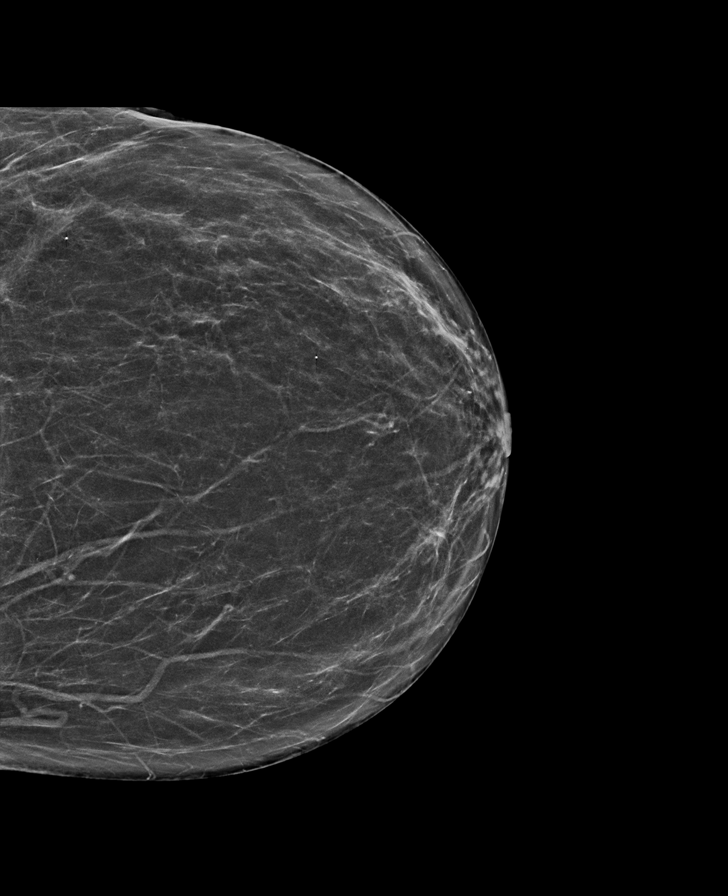

[R MLO synth-2D]
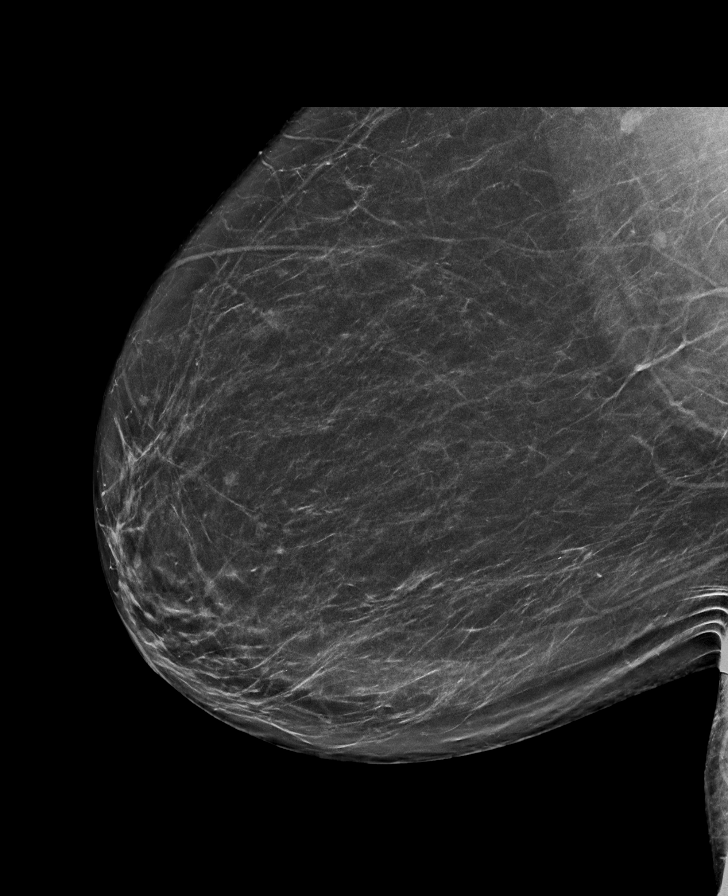

[L MLO synth-2D]
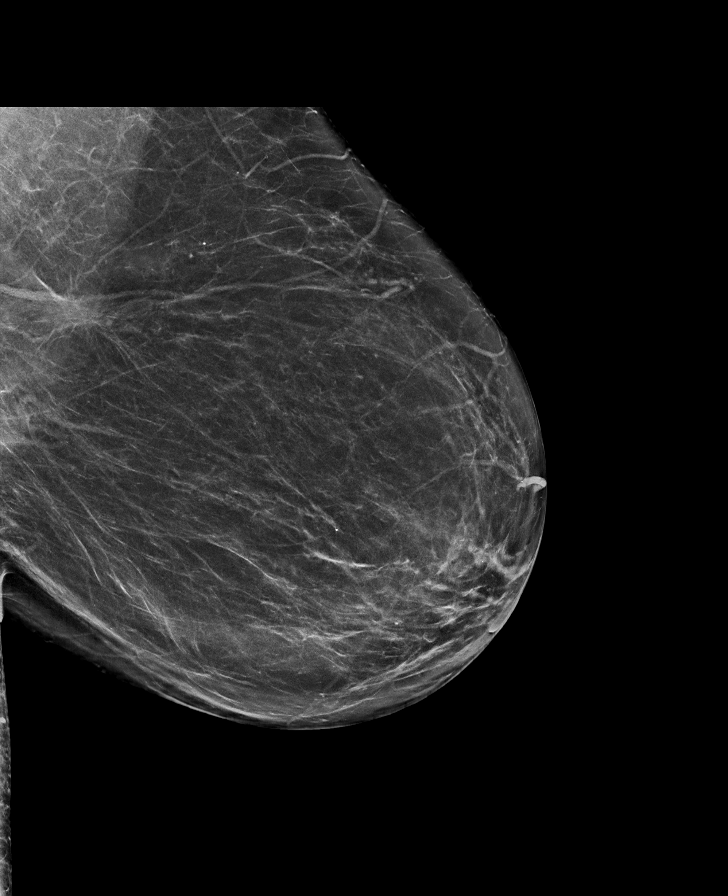

[L MLO tomo · tomo slice 41/80.0]
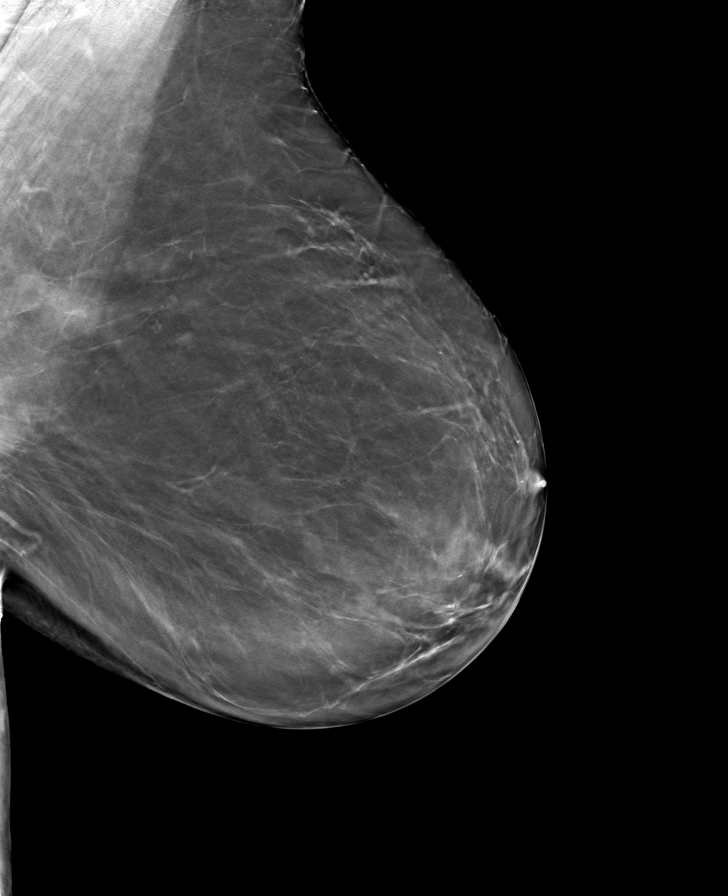

[R MLO tomo · tomo slice 39/77.0]
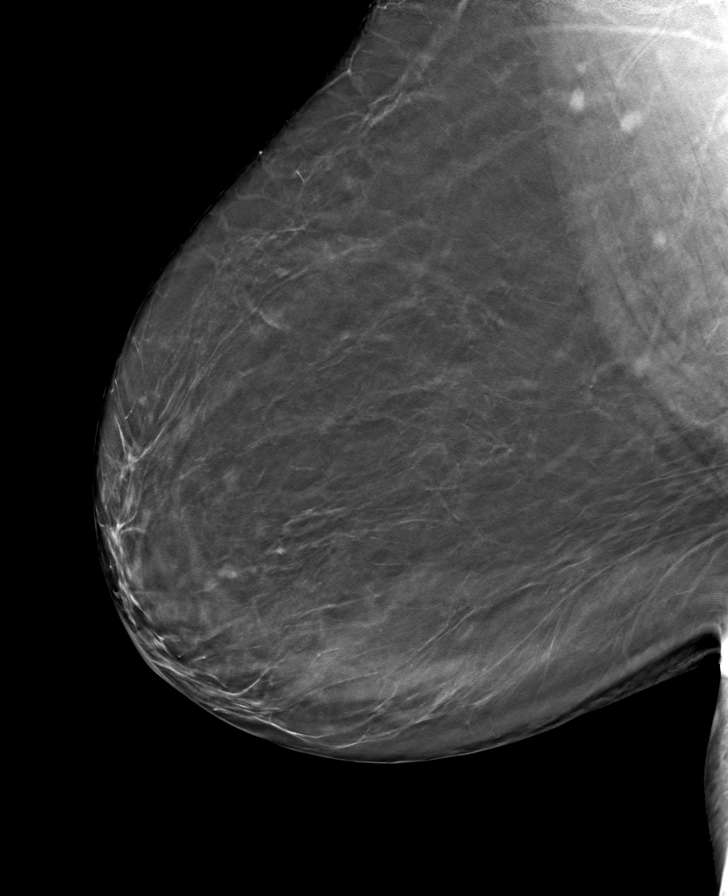

[R CC tomo · tomo slice 35/69.0]
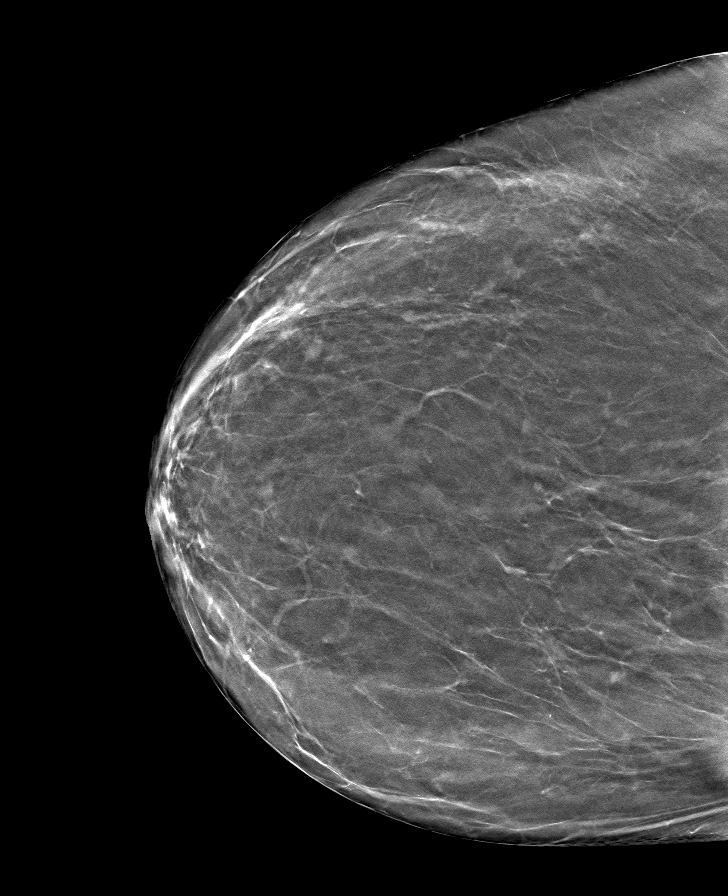

[L CC tomo · tomo slice 33/65.0]
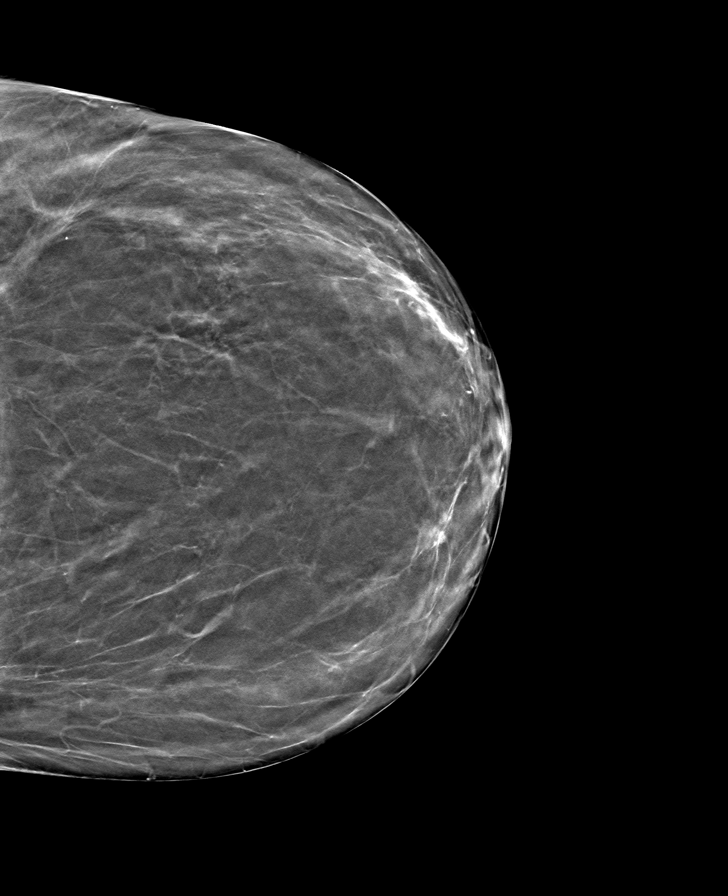

[8 of 24 positions shown; findings below may reference images not displayed]

ACR Breast Density Category b: There are scattered areas of
fibroglandular density.
FINDINGS: There are no findings suspicious for malignancy. Images were
processed with CAD.
IMPRESSION: No mammographic evidence of malignancy. A result letter of this
screening mammogram will be mailed directly to the patient.

RECOMMENDATION:
Screening mammogram in one year. (Code:[id number])

BI-RADS CATEGORY  1: Negative.

## 2020-09-15 DIAGNOSIS — R269 Unspecified abnormalities of gait and mobility: Secondary | ICD-10-CM | POA: Diagnosis not present

## 2020-09-15 DIAGNOSIS — M5416 Radiculopathy, lumbar region: Secondary | ICD-10-CM | POA: Diagnosis not present

## 2020-09-15 DIAGNOSIS — M545 Low back pain, unspecified: Secondary | ICD-10-CM | POA: Diagnosis not present

## 2020-09-15 DIAGNOSIS — M79605 Pain in left leg: Secondary | ICD-10-CM | POA: Diagnosis not present

## 2020-09-21 DIAGNOSIS — M79605 Pain in left leg: Secondary | ICD-10-CM | POA: Diagnosis not present

## 2020-09-21 DIAGNOSIS — M5416 Radiculopathy, lumbar region: Secondary | ICD-10-CM | POA: Diagnosis not present

## 2020-09-21 DIAGNOSIS — M545 Low back pain, unspecified: Secondary | ICD-10-CM | POA: Diagnosis not present

## 2020-09-21 DIAGNOSIS — R269 Unspecified abnormalities of gait and mobility: Secondary | ICD-10-CM | POA: Diagnosis not present

## 2020-09-26 DIAGNOSIS — R269 Unspecified abnormalities of gait and mobility: Secondary | ICD-10-CM | POA: Diagnosis not present

## 2020-09-26 DIAGNOSIS — M79605 Pain in left leg: Secondary | ICD-10-CM | POA: Diagnosis not present

## 2020-09-26 DIAGNOSIS — M545 Low back pain, unspecified: Secondary | ICD-10-CM | POA: Diagnosis not present

## 2020-09-26 DIAGNOSIS — M5126 Other intervertebral disc displacement, lumbar region: Secondary | ICD-10-CM | POA: Diagnosis not present

## 2020-09-26 DIAGNOSIS — M5416 Radiculopathy, lumbar region: Secondary | ICD-10-CM | POA: Diagnosis not present

## 2020-10-04 DIAGNOSIS — M545 Low back pain, unspecified: Secondary | ICD-10-CM | POA: Diagnosis not present

## 2020-10-04 DIAGNOSIS — M5416 Radiculopathy, lumbar region: Secondary | ICD-10-CM | POA: Diagnosis not present

## 2020-10-04 DIAGNOSIS — R269 Unspecified abnormalities of gait and mobility: Secondary | ICD-10-CM | POA: Diagnosis not present

## 2020-10-04 DIAGNOSIS — M79605 Pain in left leg: Secondary | ICD-10-CM | POA: Diagnosis not present

## 2020-10-10 DIAGNOSIS — M545 Low back pain, unspecified: Secondary | ICD-10-CM | POA: Diagnosis not present

## 2020-10-10 DIAGNOSIS — M79605 Pain in left leg: Secondary | ICD-10-CM | POA: Diagnosis not present

## 2020-10-10 DIAGNOSIS — M5416 Radiculopathy, lumbar region: Secondary | ICD-10-CM | POA: Diagnosis not present

## 2020-10-10 DIAGNOSIS — R269 Unspecified abnormalities of gait and mobility: Secondary | ICD-10-CM | POA: Diagnosis not present

## 2020-10-17 DIAGNOSIS — R269 Unspecified abnormalities of gait and mobility: Secondary | ICD-10-CM | POA: Diagnosis not present

## 2020-10-17 DIAGNOSIS — M5416 Radiculopathy, lumbar region: Secondary | ICD-10-CM | POA: Diagnosis not present

## 2020-10-17 DIAGNOSIS — M79605 Pain in left leg: Secondary | ICD-10-CM | POA: Diagnosis not present

## 2020-10-17 DIAGNOSIS — M545 Low back pain, unspecified: Secondary | ICD-10-CM | POA: Diagnosis not present

## 2020-10-25 DIAGNOSIS — M5116 Intervertebral disc disorders with radiculopathy, lumbar region: Secondary | ICD-10-CM | POA: Diagnosis not present

## 2020-10-25 DIAGNOSIS — M5126 Other intervertebral disc displacement, lumbar region: Secondary | ICD-10-CM | POA: Diagnosis not present

## 2020-11-30 ENCOUNTER — Other Ambulatory Visit: Payer: Self-pay | Admitting: Internal Medicine

## 2020-11-30 DIAGNOSIS — Z1231 Encounter for screening mammogram for malignant neoplasm of breast: Secondary | ICD-10-CM

## 2021-01-18 DIAGNOSIS — Z853 Personal history of malignant neoplasm of breast: Secondary | ICD-10-CM | POA: Diagnosis not present

## 2021-01-18 DIAGNOSIS — R7303 Prediabetes: Secondary | ICD-10-CM | POA: Diagnosis not present

## 2021-01-18 DIAGNOSIS — M4306 Spondylolysis, lumbar region: Secondary | ICD-10-CM | POA: Diagnosis not present

## 2021-01-18 DIAGNOSIS — I10 Essential (primary) hypertension: Secondary | ICD-10-CM | POA: Diagnosis not present

## 2021-01-25 ENCOUNTER — Other Ambulatory Visit: Payer: Self-pay

## 2021-01-25 ENCOUNTER — Ambulatory Visit
Admission: RE | Admit: 2021-01-25 | Discharge: 2021-01-25 | Disposition: A | Discharge status: Home / Self Care | Payer: Federal, State, Local not specified - PPO | Source: Ambulatory Visit | Attending: Internal Medicine | Admitting: Internal Medicine

## 2021-01-25 DIAGNOSIS — Z1231 Encounter for screening mammogram for malignant neoplasm of breast: Secondary | ICD-10-CM

## 2021-02-20 DIAGNOSIS — M5416 Radiculopathy, lumbar region: Secondary | ICD-10-CM | POA: Diagnosis not present

## 2021-02-20 DIAGNOSIS — I10 Essential (primary) hypertension: Secondary | ICD-10-CM | POA: Diagnosis not present

## 2021-02-20 DIAGNOSIS — Z6839 Body mass index (BMI) 39.0-39.9, adult: Secondary | ICD-10-CM | POA: Diagnosis not present

## 2021-03-01 DIAGNOSIS — M545 Low back pain, unspecified: Secondary | ICD-10-CM | POA: Diagnosis not present

## 2021-03-01 DIAGNOSIS — M5416 Radiculopathy, lumbar region: Secondary | ICD-10-CM | POA: Diagnosis not present

## 2021-03-15 DIAGNOSIS — N6011 Diffuse cystic mastopathy of right breast: Secondary | ICD-10-CM | POA: Diagnosis not present

## 2021-03-20 DIAGNOSIS — M5416 Radiculopathy, lumbar region: Secondary | ICD-10-CM | POA: Diagnosis not present

## 2021-03-20 DIAGNOSIS — M7062 Trochanteric bursitis, left hip: Secondary | ICD-10-CM | POA: Diagnosis not present

## 2021-03-20 DIAGNOSIS — Z6841 Body Mass Index (BMI) 40.0 and over, adult: Secondary | ICD-10-CM | POA: Diagnosis not present

## 2021-03-20 DIAGNOSIS — I10 Essential (primary) hypertension: Secondary | ICD-10-CM | POA: Diagnosis not present

## 2021-04-11 DIAGNOSIS — M5416 Radiculopathy, lumbar region: Secondary | ICD-10-CM | POA: Diagnosis not present

## 2021-04-11 DIAGNOSIS — Z6841 Body Mass Index (BMI) 40.0 and over, adult: Secondary | ICD-10-CM | POA: Diagnosis not present

## 2021-04-11 DIAGNOSIS — I10 Essential (primary) hypertension: Secondary | ICD-10-CM | POA: Diagnosis not present

## 2021-05-22 DIAGNOSIS — M4800 Spinal stenosis, site unspecified: Secondary | ICD-10-CM | POA: Diagnosis not present

## 2021-05-22 DIAGNOSIS — J309 Allergic rhinitis, unspecified: Secondary | ICD-10-CM | POA: Diagnosis not present

## 2021-05-22 DIAGNOSIS — Z803 Family history of malignant neoplasm of breast: Secondary | ICD-10-CM | POA: Diagnosis not present

## 2021-05-22 DIAGNOSIS — G629 Polyneuropathy, unspecified: Secondary | ICD-10-CM | POA: Diagnosis not present

## 2021-05-22 DIAGNOSIS — G8929 Other chronic pain: Secondary | ICD-10-CM | POA: Diagnosis not present

## 2021-05-22 DIAGNOSIS — Z6839 Body mass index (BMI) 39.0-39.9, adult: Secondary | ICD-10-CM | POA: Diagnosis not present

## 2021-05-22 DIAGNOSIS — I10 Essential (primary) hypertension: Secondary | ICD-10-CM | POA: Diagnosis not present

## 2021-05-22 DIAGNOSIS — Z8249 Family history of ischemic heart disease and other diseases of the circulatory system: Secondary | ICD-10-CM | POA: Diagnosis not present

## 2021-05-22 DIAGNOSIS — M545 Low back pain, unspecified: Secondary | ICD-10-CM | POA: Diagnosis not present

## 2021-05-22 DIAGNOSIS — M1990 Unspecified osteoarthritis, unspecified site: Secondary | ICD-10-CM | POA: Diagnosis not present

## 2021-05-22 DIAGNOSIS — E785 Hyperlipidemia, unspecified: Secondary | ICD-10-CM | POA: Diagnosis not present

## 2021-05-22 DIAGNOSIS — Z880 Allergy status to penicillin: Secondary | ICD-10-CM | POA: Diagnosis not present

## 2021-05-22 DIAGNOSIS — Z008 Encounter for other general examination: Secondary | ICD-10-CM | POA: Diagnosis not present

## 2021-05-22 DIAGNOSIS — H547 Unspecified visual loss: Secondary | ICD-10-CM | POA: Diagnosis not present

## 2021-06-12 DIAGNOSIS — Z981 Arthrodesis status: Secondary | ICD-10-CM | POA: Diagnosis not present

## 2021-06-12 DIAGNOSIS — R69 Illness, unspecified: Secondary | ICD-10-CM | POA: Diagnosis not present

## 2021-06-12 DIAGNOSIS — M5416 Radiculopathy, lumbar region: Secondary | ICD-10-CM | POA: Diagnosis not present

## 2021-06-12 DIAGNOSIS — F1120 Opioid dependence, uncomplicated: Secondary | ICD-10-CM | POA: Diagnosis not present

## 2021-07-24 DIAGNOSIS — E7800 Pure hypercholesterolemia, unspecified: Secondary | ICD-10-CM | POA: Diagnosis not present

## 2021-07-24 DIAGNOSIS — R7303 Prediabetes: Secondary | ICD-10-CM | POA: Diagnosis not present

## 2021-07-24 DIAGNOSIS — D649 Anemia, unspecified: Secondary | ICD-10-CM | POA: Diagnosis not present

## 2021-07-24 DIAGNOSIS — E559 Vitamin D deficiency, unspecified: Secondary | ICD-10-CM | POA: Diagnosis not present

## 2021-07-24 DIAGNOSIS — I10 Essential (primary) hypertension: Secondary | ICD-10-CM | POA: Diagnosis not present

## 2021-07-24 DIAGNOSIS — Z0000 Encounter for general adult medical examination without abnormal findings: Secondary | ICD-10-CM | POA: Diagnosis not present

## 2021-07-30 DIAGNOSIS — H0102A Squamous blepharitis right eye, upper and lower eyelids: Secondary | ICD-10-CM | POA: Diagnosis not present

## 2021-07-30 DIAGNOSIS — H40013 Open angle with borderline findings, low risk, bilateral: Secondary | ICD-10-CM | POA: Diagnosis not present

## 2021-07-30 DIAGNOSIS — D492 Neoplasm of unspecified behavior of bone, soft tissue, and skin: Secondary | ICD-10-CM | POA: Diagnosis not present

## 2021-07-30 DIAGNOSIS — H0102B Squamous blepharitis left eye, upper and lower eyelids: Secondary | ICD-10-CM | POA: Diagnosis not present

## 2021-08-29 DIAGNOSIS — M5416 Radiculopathy, lumbar region: Secondary | ICD-10-CM | POA: Diagnosis not present

## 2021-08-29 DIAGNOSIS — Z981 Arthrodesis status: Secondary | ICD-10-CM | POA: Diagnosis not present

## 2021-08-31 DIAGNOSIS — D492 Neoplasm of unspecified behavior of bone, soft tissue, and skin: Secondary | ICD-10-CM | POA: Diagnosis not present

## 2021-09-03 DIAGNOSIS — Z01419 Encounter for gynecological examination (general) (routine) without abnormal findings: Secondary | ICD-10-CM | POA: Diagnosis not present

## 2021-09-13 DIAGNOSIS — R7303 Prediabetes: Secondary | ICD-10-CM | POA: Diagnosis not present

## 2021-09-13 DIAGNOSIS — Z6841 Body Mass Index (BMI) 40.0 and over, adult: Secondary | ICD-10-CM | POA: Diagnosis not present

## 2021-09-13 DIAGNOSIS — Z713 Dietary counseling and surveillance: Secondary | ICD-10-CM | POA: Diagnosis not present

## 2021-09-18 DIAGNOSIS — I10 Essential (primary) hypertension: Secondary | ICD-10-CM | POA: Diagnosis not present

## 2021-09-18 DIAGNOSIS — Z6839 Body mass index (BMI) 39.0-39.9, adult: Secondary | ICD-10-CM | POA: Diagnosis not present

## 2021-09-18 DIAGNOSIS — M4180 Other forms of scoliosis, site unspecified: Secondary | ICD-10-CM | POA: Diagnosis not present

## 2021-10-17 DIAGNOSIS — Z713 Dietary counseling and surveillance: Secondary | ICD-10-CM | POA: Diagnosis not present

## 2021-10-17 DIAGNOSIS — Z6841 Body Mass Index (BMI) 40.0 and over, adult: Secondary | ICD-10-CM | POA: Diagnosis not present

## 2021-10-19 ENCOUNTER — Other Ambulatory Visit: Payer: Self-pay | Admitting: Obstetrics

## 2021-10-19 DIAGNOSIS — N951 Menopausal and female climacteric states: Secondary | ICD-10-CM

## 2021-10-19 DIAGNOSIS — E2839 Other primary ovarian failure: Secondary | ICD-10-CM

## 2021-11-14 DIAGNOSIS — M5416 Radiculopathy, lumbar region: Secondary | ICD-10-CM | POA: Diagnosis not present

## 2021-11-14 DIAGNOSIS — M4180 Other forms of scoliosis, site unspecified: Secondary | ICD-10-CM | POA: Diagnosis not present

## 2021-11-14 DIAGNOSIS — M5126 Other intervertebral disc displacement, lumbar region: Secondary | ICD-10-CM | POA: Diagnosis not present

## 2021-12-12 ENCOUNTER — Other Ambulatory Visit: Payer: Self-pay | Admitting: Internal Medicine

## 2021-12-12 DIAGNOSIS — Z1231 Encounter for screening mammogram for malignant neoplasm of breast: Secondary | ICD-10-CM

## 2022-01-14 DIAGNOSIS — Z6839 Body mass index (BMI) 39.0-39.9, adult: Secondary | ICD-10-CM | POA: Diagnosis not present

## 2022-01-14 DIAGNOSIS — Z713 Dietary counseling and surveillance: Secondary | ICD-10-CM | POA: Diagnosis not present

## 2022-01-18 DIAGNOSIS — M5442 Lumbago with sciatica, left side: Secondary | ICD-10-CM | POA: Diagnosis not present

## 2022-01-18 DIAGNOSIS — M5416 Radiculopathy, lumbar region: Secondary | ICD-10-CM | POA: Diagnosis not present

## 2022-01-18 DIAGNOSIS — G8929 Other chronic pain: Secondary | ICD-10-CM | POA: Diagnosis not present

## 2022-01-21 DIAGNOSIS — E892 Postprocedural hypoparathyroidism: Secondary | ICD-10-CM | POA: Diagnosis not present

## 2022-01-21 DIAGNOSIS — I10 Essential (primary) hypertension: Secondary | ICD-10-CM | POA: Diagnosis not present

## 2022-01-21 DIAGNOSIS — M4306 Spondylolysis, lumbar region: Secondary | ICD-10-CM | POA: Diagnosis not present

## 2022-01-21 DIAGNOSIS — Z9889 Other specified postprocedural states: Secondary | ICD-10-CM | POA: Diagnosis not present

## 2022-01-21 DIAGNOSIS — J309 Allergic rhinitis, unspecified: Secondary | ICD-10-CM | POA: Diagnosis not present

## 2022-01-21 DIAGNOSIS — Z853 Personal history of malignant neoplasm of breast: Secondary | ICD-10-CM | POA: Diagnosis not present

## 2022-01-21 DIAGNOSIS — G8929 Other chronic pain: Secondary | ICD-10-CM | POA: Diagnosis not present

## 2022-01-21 DIAGNOSIS — R7303 Prediabetes: Secondary | ICD-10-CM | POA: Diagnosis not present

## 2022-01-21 DIAGNOSIS — E559 Vitamin D deficiency, unspecified: Secondary | ICD-10-CM | POA: Diagnosis not present

## 2022-01-21 DIAGNOSIS — G43909 Migraine, unspecified, not intractable, without status migrainosus: Secondary | ICD-10-CM | POA: Diagnosis not present

## 2022-01-28 ENCOUNTER — Ambulatory Visit
Admission: RE | Admit: 2022-01-28 | Discharge: 2022-01-28 | Disposition: A | Discharge status: Home / Self Care | Payer: Federal, State, Local not specified - PPO | Source: Ambulatory Visit | Attending: Internal Medicine | Admitting: Internal Medicine

## 2022-01-28 DIAGNOSIS — Z1231 Encounter for screening mammogram for malignant neoplasm of breast: Secondary | ICD-10-CM

## 2022-01-29 ENCOUNTER — Other Ambulatory Visit: Payer: Self-pay | Admitting: Internal Medicine

## 2022-01-29 DIAGNOSIS — M5416 Radiculopathy, lumbar region: Secondary | ICD-10-CM | POA: Diagnosis not present

## 2022-01-29 DIAGNOSIS — R69 Illness, unspecified: Secondary | ICD-10-CM | POA: Diagnosis not present

## 2022-01-29 DIAGNOSIS — R928 Other abnormal and inconclusive findings on diagnostic imaging of breast: Secondary | ICD-10-CM

## 2022-01-29 DIAGNOSIS — M5442 Lumbago with sciatica, left side: Secondary | ICD-10-CM | POA: Diagnosis not present

## 2022-01-29 DIAGNOSIS — F1120 Opioid dependence, uncomplicated: Secondary | ICD-10-CM | POA: Diagnosis not present

## 2022-02-05 DIAGNOSIS — M5126 Other intervertebral disc displacement, lumbar region: Secondary | ICD-10-CM | POA: Diagnosis not present

## 2022-02-05 DIAGNOSIS — M5442 Lumbago with sciatica, left side: Secondary | ICD-10-CM | POA: Diagnosis not present

## 2022-02-07 ENCOUNTER — Other Ambulatory Visit: Payer: Self-pay | Admitting: Internal Medicine

## 2022-02-07 ENCOUNTER — Ambulatory Visit
Admission: RE | Admit: 2022-02-07 | Discharge: 2022-02-07 | Disposition: A | Discharge status: Home / Self Care | Payer: Federal, State, Local not specified - PPO | Source: Ambulatory Visit | Attending: Internal Medicine | Admitting: Internal Medicine

## 2022-02-07 DIAGNOSIS — R928 Other abnormal and inconclusive findings on diagnostic imaging of breast: Secondary | ICD-10-CM

## 2022-02-07 DIAGNOSIS — N6311 Unspecified lump in the right breast, upper outer quadrant: Secondary | ICD-10-CM | POA: Diagnosis not present

## 2022-02-07 DIAGNOSIS — N6313 Unspecified lump in the right breast, lower outer quadrant: Secondary | ICD-10-CM | POA: Diagnosis not present

## 2022-02-08 ENCOUNTER — Other Ambulatory Visit: Payer: Self-pay | Admitting: Internal Medicine

## 2022-02-08 ENCOUNTER — Ambulatory Visit
Admission: RE | Admit: 2022-02-08 | Discharge: 2022-02-08 | Disposition: A | Discharge status: Home / Self Care | Payer: Federal, State, Local not specified - PPO | Source: Ambulatory Visit | Attending: Internal Medicine | Admitting: Internal Medicine

## 2022-02-08 DIAGNOSIS — N6311 Unspecified lump in the right breast, upper outer quadrant: Secondary | ICD-10-CM

## 2022-02-08 DIAGNOSIS — N6011 Diffuse cystic mastopathy of right breast: Secondary | ICD-10-CM | POA: Diagnosis not present

## 2022-02-08 DIAGNOSIS — N6315 Unspecified lump in the right breast, overlapping quadrants: Secondary | ICD-10-CM | POA: Diagnosis not present

## 2022-02-13 ENCOUNTER — Other Ambulatory Visit: Payer: Self-pay | Admitting: Internal Medicine

## 2022-02-13 DIAGNOSIS — N6452 Nipple discharge: Secondary | ICD-10-CM

## 2022-02-14 ENCOUNTER — Ambulatory Visit
Admission: RE | Admit: 2022-02-14 | Discharge: 2022-02-14 | Disposition: A | Discharge status: Home / Self Care | Payer: Federal, State, Local not specified - PPO | Source: Ambulatory Visit | Attending: Internal Medicine | Admitting: Internal Medicine

## 2022-02-14 DIAGNOSIS — N644 Mastodynia: Secondary | ICD-10-CM | POA: Diagnosis not present

## 2022-02-14 DIAGNOSIS — N6452 Nipple discharge: Secondary | ICD-10-CM

## 2022-02-20 ENCOUNTER — Ambulatory Visit
Admission: RE | Admit: 2022-02-20 | Discharge: 2022-02-20 | Disposition: A | Discharge status: Home / Self Care | Payer: Federal, State, Local not specified - PPO | Source: Ambulatory Visit | Attending: Internal Medicine | Admitting: Internal Medicine

## 2022-02-20 DIAGNOSIS — N6311 Unspecified lump in the right breast, upper outer quadrant: Secondary | ICD-10-CM

## 2022-03-01 ENCOUNTER — Ambulatory Visit
Admission: RE | Admit: 2022-03-01 | Discharge: 2022-03-01 | Disposition: A | Discharge status: Home / Self Care | Payer: Federal, State, Local not specified - PPO | Source: Ambulatory Visit | Attending: Internal Medicine | Admitting: Internal Medicine

## 2022-03-01 ENCOUNTER — Ambulatory Visit: Admission: RE | Admit: 2022-03-01 | Payer: Federal, State, Local not specified - PPO | Source: Ambulatory Visit

## 2022-03-01 DIAGNOSIS — Z6837 Body mass index (BMI) 37.0-37.9, adult: Secondary | ICD-10-CM | POA: Diagnosis not present

## 2022-03-01 DIAGNOSIS — M5416 Radiculopathy, lumbar region: Secondary | ICD-10-CM | POA: Diagnosis not present

## 2022-03-04 DIAGNOSIS — E038 Other specified hypothyroidism: Secondary | ICD-10-CM | POA: Diagnosis not present

## 2022-03-15 ENCOUNTER — Ambulatory Visit
Admission: RE | Admit: 2022-03-15 | Discharge: 2022-03-15 | Disposition: A | Discharge status: Home / Self Care | Payer: Federal, State, Local not specified - PPO | Source: Ambulatory Visit | Attending: Internal Medicine | Admitting: Internal Medicine

## 2022-03-15 ENCOUNTER — Ambulatory Visit
Admission: RE | Admit: 2022-03-15 | Discharge: 2022-03-15 | Disposition: A | Discharge status: Home / Self Care | Payer: Medicare HMO | Source: Ambulatory Visit | Attending: Internal Medicine | Admitting: Internal Medicine

## 2022-03-15 DIAGNOSIS — N6011 Diffuse cystic mastopathy of right breast: Secondary | ICD-10-CM | POA: Diagnosis not present

## 2022-03-15 DIAGNOSIS — N6311 Unspecified lump in the right breast, upper outer quadrant: Secondary | ICD-10-CM | POA: Diagnosis not present

## 2022-03-19 DIAGNOSIS — H938X3 Other specified disorders of ear, bilateral: Secondary | ICD-10-CM | POA: Diagnosis not present

## 2022-03-21 DIAGNOSIS — M5416 Radiculopathy, lumbar region: Secondary | ICD-10-CM | POA: Diagnosis not present

## 2022-04-10 DIAGNOSIS — Z6839 Body mass index (BMI) 39.0-39.9, adult: Secondary | ICD-10-CM | POA: Diagnosis not present

## 2022-04-10 DIAGNOSIS — Z713 Dietary counseling and surveillance: Secondary | ICD-10-CM | POA: Diagnosis not present

## 2022-04-10 DIAGNOSIS — Z6836 Body mass index (BMI) 36.0-36.9, adult: Secondary | ICD-10-CM | POA: Diagnosis not present

## 2022-04-15 ENCOUNTER — Ambulatory Visit
Admission: RE | Admit: 2022-04-15 | Discharge: 2022-04-15 | Disposition: A | Discharge status: Home / Self Care | Payer: Federal, State, Local not specified - PPO | Source: Ambulatory Visit | Attending: Obstetrics | Admitting: Obstetrics

## 2022-04-15 DIAGNOSIS — E2839 Other primary ovarian failure: Secondary | ICD-10-CM

## 2022-04-15 DIAGNOSIS — Z780 Asymptomatic menopausal state: Secondary | ICD-10-CM | POA: Diagnosis not present

## 2022-04-15 DIAGNOSIS — N951 Menopausal and female climacteric states: Secondary | ICD-10-CM

## 2022-04-22 DIAGNOSIS — Z6834 Body mass index (BMI) 34.0-34.9, adult: Secondary | ICD-10-CM | POA: Diagnosis not present

## 2022-04-22 DIAGNOSIS — M5416 Radiculopathy, lumbar region: Secondary | ICD-10-CM | POA: Diagnosis not present

## 2022-04-22 DIAGNOSIS — F1120 Opioid dependence, uncomplicated: Secondary | ICD-10-CM | POA: Diagnosis not present

## 2022-04-22 DIAGNOSIS — M5442 Lumbago with sciatica, left side: Secondary | ICD-10-CM | POA: Diagnosis not present

## 2022-04-22 DIAGNOSIS — M961 Postlaminectomy syndrome, not elsewhere classified: Secondary | ICD-10-CM | POA: Diagnosis not present

## 2022-04-22 DIAGNOSIS — R69 Illness, unspecified: Secondary | ICD-10-CM | POA: Diagnosis not present

## 2022-05-22 DIAGNOSIS — H903 Sensorineural hearing loss, bilateral: Secondary | ICD-10-CM | POA: Diagnosis not present

## 2022-05-22 DIAGNOSIS — H6992 Unspecified Eustachian tube disorder, left ear: Secondary | ICD-10-CM | POA: Diagnosis not present

## 2022-06-11 DIAGNOSIS — M961 Postlaminectomy syndrome, not elsewhere classified: Secondary | ICD-10-CM | POA: Diagnosis not present

## 2022-06-17 ENCOUNTER — Other Ambulatory Visit: Payer: Self-pay | Admitting: Internal Medicine

## 2022-06-17 DIAGNOSIS — N6081 Other benign mammary dysplasias of right breast: Secondary | ICD-10-CM

## 2022-06-20 DIAGNOSIS — M961 Postlaminectomy syndrome, not elsewhere classified: Secondary | ICD-10-CM | POA: Diagnosis not present

## 2022-06-20 DIAGNOSIS — Z6833 Body mass index (BMI) 33.0-33.9, adult: Secondary | ICD-10-CM | POA: Diagnosis not present

## 2022-07-09 DIAGNOSIS — Z713 Dietary counseling and surveillance: Secondary | ICD-10-CM | POA: Diagnosis not present

## 2022-07-09 DIAGNOSIS — Z6832 Body mass index (BMI) 32.0-32.9, adult: Secondary | ICD-10-CM | POA: Diagnosis not present

## 2022-07-10 DIAGNOSIS — M961 Postlaminectomy syndrome, not elsewhere classified: Secondary | ICD-10-CM | POA: Diagnosis not present

## 2022-07-10 DIAGNOSIS — M5380 Other specified dorsopathies, site unspecified: Secondary | ICD-10-CM | POA: Diagnosis not present

## 2022-07-29 DIAGNOSIS — E038 Other specified hypothyroidism: Secondary | ICD-10-CM | POA: Diagnosis not present

## 2022-07-29 DIAGNOSIS — R7303 Prediabetes: Secondary | ICD-10-CM | POA: Diagnosis not present

## 2022-07-29 DIAGNOSIS — E7800 Pure hypercholesterolemia, unspecified: Secondary | ICD-10-CM | POA: Diagnosis not present

## 2022-07-29 DIAGNOSIS — Z6832 Body mass index (BMI) 32.0-32.9, adult: Secondary | ICD-10-CM | POA: Diagnosis not present

## 2022-07-29 DIAGNOSIS — E559 Vitamin D deficiency, unspecified: Secondary | ICD-10-CM | POA: Diagnosis not present

## 2022-07-29 DIAGNOSIS — I10 Essential (primary) hypertension: Secondary | ICD-10-CM | POA: Diagnosis not present

## 2022-07-29 DIAGNOSIS — Z0000 Encounter for general adult medical examination without abnormal findings: Secondary | ICD-10-CM | POA: Diagnosis not present

## 2022-07-31 DIAGNOSIS — H40013 Open angle with borderline findings, low risk, bilateral: Secondary | ICD-10-CM | POA: Diagnosis not present

## 2022-07-31 DIAGNOSIS — H0102A Squamous blepharitis right eye, upper and lower eyelids: Secondary | ICD-10-CM | POA: Diagnosis not present

## 2022-07-31 DIAGNOSIS — H2513 Age-related nuclear cataract, bilateral: Secondary | ICD-10-CM | POA: Diagnosis not present

## 2022-07-31 DIAGNOSIS — H0102B Squamous blepharitis left eye, upper and lower eyelids: Secondary | ICD-10-CM | POA: Diagnosis not present

## 2022-09-05 DIAGNOSIS — Z6832 Body mass index (BMI) 32.0-32.9, adult: Secondary | ICD-10-CM | POA: Diagnosis not present

## 2022-09-05 DIAGNOSIS — Z01411 Encounter for gynecological examination (general) (routine) with abnormal findings: Secondary | ICD-10-CM | POA: Diagnosis not present

## 2022-09-05 DIAGNOSIS — Z713 Dietary counseling and surveillance: Secondary | ICD-10-CM | POA: Diagnosis not present

## 2022-09-17 ENCOUNTER — Ambulatory Visit
Admission: RE | Admit: 2022-09-17 | Discharge: 2022-09-17 | Disposition: A | Discharge status: Home / Self Care | Payer: Federal, State, Local not specified - PPO | Source: Ambulatory Visit | Attending: Internal Medicine | Admitting: Internal Medicine

## 2022-09-17 DIAGNOSIS — R928 Other abnormal and inconclusive findings on diagnostic imaging of breast: Secondary | ICD-10-CM | POA: Diagnosis not present

## 2022-09-17 DIAGNOSIS — N6081 Other benign mammary dysplasias of right breast: Secondary | ICD-10-CM

## 2022-11-05 DIAGNOSIS — Z9689 Presence of other specified functional implants: Secondary | ICD-10-CM | POA: Diagnosis not present

## 2022-11-05 DIAGNOSIS — M961 Postlaminectomy syndrome, not elsewhere classified: Secondary | ICD-10-CM | POA: Diagnosis not present

## 2022-11-05 DIAGNOSIS — M5416 Radiculopathy, lumbar region: Secondary | ICD-10-CM | POA: Diagnosis not present

## 2022-11-27 DIAGNOSIS — H903 Sensorineural hearing loss, bilateral: Secondary | ICD-10-CM | POA: Diagnosis not present

## 2022-11-27 DIAGNOSIS — H6982 Other specified disorders of Eustachian tube, left ear: Secondary | ICD-10-CM | POA: Diagnosis not present

## 2022-12-04 ENCOUNTER — Other Ambulatory Visit: Payer: Self-pay | Admitting: Otolaryngology

## 2022-12-04 ENCOUNTER — Other Ambulatory Visit: Payer: Self-pay | Admitting: Internal Medicine

## 2022-12-04 DIAGNOSIS — H918X3 Other specified hearing loss, bilateral: Secondary | ICD-10-CM

## 2022-12-04 DIAGNOSIS — Z1231 Encounter for screening mammogram for malignant neoplasm of breast: Secondary | ICD-10-CM

## 2023-01-02 DIAGNOSIS — Z9889 Other specified postprocedural states: Secondary | ICD-10-CM | POA: Diagnosis not present

## 2023-01-02 DIAGNOSIS — R7303 Prediabetes: Secondary | ICD-10-CM | POA: Diagnosis not present

## 2023-01-02 DIAGNOSIS — Z853 Personal history of malignant neoplasm of breast: Secondary | ICD-10-CM | POA: Diagnosis not present

## 2023-01-02 DIAGNOSIS — I10 Essential (primary) hypertension: Secondary | ICD-10-CM | POA: Diagnosis not present

## 2023-01-16 ENCOUNTER — Ambulatory Visit
Admission: RE | Admit: 2023-01-16 | Discharge: 2023-01-16 | Disposition: A | Discharge status: Home / Self Care | Payer: Federal, State, Local not specified - PPO | Source: Ambulatory Visit | Attending: Otolaryngology | Admitting: Otolaryngology

## 2023-01-16 DIAGNOSIS — H918X3 Other specified hearing loss, bilateral: Secondary | ICD-10-CM

## 2023-01-16 DIAGNOSIS — H9190 Unspecified hearing loss, unspecified ear: Secondary | ICD-10-CM | POA: Diagnosis not present

## 2023-01-16 MED ORDER — GADOPICLENOL 0.5 MMOL/ML IV SOLN
8.0000 mL | Freq: Once | INTRAVENOUS | Status: AC | PRN
  Administered 2023-01-16: 8 mL via INTRAVENOUS

## 2023-01-31 ENCOUNTER — Ambulatory Visit
Admission: RE | Admit: 2023-01-31 | Discharge: 2023-01-31 | Disposition: A | Discharge status: Home / Self Care | Payer: Federal, State, Local not specified - PPO | Source: Ambulatory Visit | Attending: Internal Medicine | Admitting: Internal Medicine

## 2023-01-31 DIAGNOSIS — Z1231 Encounter for screening mammogram for malignant neoplasm of breast: Secondary | ICD-10-CM | POA: Diagnosis not present

## 2023-02-18 DIAGNOSIS — H6983 Other specified disorders of Eustachian tube, bilateral: Secondary | ICD-10-CM | POA: Diagnosis not present

## 2023-02-18 DIAGNOSIS — H903 Sensorineural hearing loss, bilateral: Secondary | ICD-10-CM | POA: Diagnosis not present

## 2023-02-18 DIAGNOSIS — J310 Chronic rhinitis: Secondary | ICD-10-CM | POA: Diagnosis not present

## 2023-02-18 DIAGNOSIS — M961 Postlaminectomy syndrome, not elsewhere classified: Secondary | ICD-10-CM | POA: Diagnosis not present

## 2023-03-17 DIAGNOSIS — Z86010 Personal history of colonic polyps: Secondary | ICD-10-CM | POA: Diagnosis not present

## 2023-03-17 DIAGNOSIS — Z09 Encounter for follow-up examination after completed treatment for conditions other than malignant neoplasm: Secondary | ICD-10-CM | POA: Diagnosis not present

## 2023-03-17 DIAGNOSIS — K648 Other hemorrhoids: Secondary | ICD-10-CM | POA: Diagnosis not present

## 2023-04-24 DIAGNOSIS — Z9689 Presence of other specified functional implants: Secondary | ICD-10-CM | POA: Diagnosis not present

## 2023-04-24 DIAGNOSIS — M5416 Radiculopathy, lumbar region: Secondary | ICD-10-CM | POA: Diagnosis not present

## 2023-04-24 DIAGNOSIS — M961 Postlaminectomy syndrome, not elsewhere classified: Secondary | ICD-10-CM | POA: Diagnosis not present

## 2023-04-24 DIAGNOSIS — F1120 Opioid dependence, uncomplicated: Secondary | ICD-10-CM | POA: Diagnosis not present

## 2023-07-11 DIAGNOSIS — M1612 Unilateral primary osteoarthritis, left hip: Secondary | ICD-10-CM | POA: Diagnosis not present

## 2023-07-25 DIAGNOSIS — Z9689 Presence of other specified functional implants: Secondary | ICD-10-CM | POA: Diagnosis not present

## 2023-07-25 DIAGNOSIS — F1120 Opioid dependence, uncomplicated: Secondary | ICD-10-CM | POA: Diagnosis not present

## 2023-07-25 DIAGNOSIS — M961 Postlaminectomy syndrome, not elsewhere classified: Secondary | ICD-10-CM | POA: Diagnosis not present

## 2023-07-25 DIAGNOSIS — M5416 Radiculopathy, lumbar region: Secondary | ICD-10-CM | POA: Diagnosis not present

## 2023-08-06 DIAGNOSIS — H0102B Squamous blepharitis left eye, upper and lower eyelids: Secondary | ICD-10-CM | POA: Diagnosis not present

## 2023-08-06 DIAGNOSIS — H40013 Open angle with borderline findings, low risk, bilateral: Secondary | ICD-10-CM | POA: Diagnosis not present

## 2023-08-06 DIAGNOSIS — H2513 Age-related nuclear cataract, bilateral: Secondary | ICD-10-CM | POA: Diagnosis not present

## 2023-08-06 DIAGNOSIS — H0102A Squamous blepharitis right eye, upper and lower eyelids: Secondary | ICD-10-CM | POA: Diagnosis not present

## 2023-08-14 DIAGNOSIS — E038 Other specified hypothyroidism: Secondary | ICD-10-CM | POA: Diagnosis not present

## 2023-08-14 DIAGNOSIS — G43909 Migraine, unspecified, not intractable, without status migrainosus: Secondary | ICD-10-CM | POA: Diagnosis not present

## 2023-08-14 DIAGNOSIS — Z0000 Encounter for general adult medical examination without abnormal findings: Secondary | ICD-10-CM | POA: Diagnosis not present

## 2023-08-14 DIAGNOSIS — Z853 Personal history of malignant neoplasm of breast: Secondary | ICD-10-CM | POA: Diagnosis not present

## 2023-08-14 DIAGNOSIS — Z13220 Encounter for screening for lipoid disorders: Secondary | ICD-10-CM | POA: Diagnosis not present

## 2023-08-14 DIAGNOSIS — I10 Essential (primary) hypertension: Secondary | ICD-10-CM | POA: Diagnosis not present

## 2023-08-14 DIAGNOSIS — R7303 Prediabetes: Secondary | ICD-10-CM | POA: Diagnosis not present

## 2023-08-15 ENCOUNTER — Emergency Department (HOSPITAL_COMMUNITY)
Admission: EM | Admit: 2023-08-15 | Discharge: 2023-08-15 | Disposition: A | Discharge status: Home / Self Care | Payer: Federal, State, Local not specified - PPO | Attending: Emergency Medicine | Admitting: Emergency Medicine

## 2023-08-15 ENCOUNTER — Emergency Department (HOSPITAL_COMMUNITY): Payer: Federal, State, Local not specified - PPO

## 2023-08-15 ENCOUNTER — Other Ambulatory Visit: Payer: Self-pay

## 2023-08-15 ENCOUNTER — Encounter (HOSPITAL_COMMUNITY): Payer: Self-pay

## 2023-08-15 DIAGNOSIS — H53462 Homonymous bilateral field defects, left side: Secondary | ICD-10-CM | POA: Diagnosis not present

## 2023-08-15 DIAGNOSIS — H53132 Sudden visual loss, left eye: Secondary | ICD-10-CM | POA: Diagnosis not present

## 2023-08-15 DIAGNOSIS — H2513 Age-related nuclear cataract, bilateral: Secondary | ICD-10-CM | POA: Diagnosis not present

## 2023-08-15 DIAGNOSIS — H5702 Anisocoria: Secondary | ICD-10-CM | POA: Diagnosis not present

## 2023-08-15 DIAGNOSIS — Z91040 Latex allergy status: Secondary | ICD-10-CM | POA: Insufficient documentation

## 2023-08-15 DIAGNOSIS — H538 Other visual disturbances: Secondary | ICD-10-CM | POA: Diagnosis not present

## 2023-08-15 DIAGNOSIS — I10 Essential (primary) hypertension: Secondary | ICD-10-CM | POA: Diagnosis not present

## 2023-08-15 DIAGNOSIS — H539 Unspecified visual disturbance: Secondary | ICD-10-CM | POA: Diagnosis not present

## 2023-08-15 DIAGNOSIS — H40013 Open angle with borderline findings, low risk, bilateral: Secondary | ICD-10-CM | POA: Diagnosis not present

## 2023-08-15 DIAGNOSIS — R519 Headache, unspecified: Secondary | ICD-10-CM | POA: Diagnosis not present

## 2023-08-15 DIAGNOSIS — I6782 Cerebral ischemia: Secondary | ICD-10-CM | POA: Insufficient documentation

## 2023-08-15 DIAGNOSIS — H43392 Other vitreous opacities, left eye: Secondary | ICD-10-CM | POA: Diagnosis not present

## 2023-08-15 DIAGNOSIS — R29818 Other symptoms and signs involving the nervous system: Secondary | ICD-10-CM | POA: Diagnosis not present

## 2023-08-15 DIAGNOSIS — I639 Cerebral infarction, unspecified: Secondary | ICD-10-CM | POA: Diagnosis not present

## 2023-08-15 LAB — PROTIME-INR
INR: 0.9 (ref 0.8–1.2)
Prothrombin Time: 12.8 s (ref 11.4–15.2)

## 2023-08-15 LAB — CBC
HCT: 37.3 % (ref 36.0–46.0)
Hemoglobin: 11.9 g/dL — ABNORMAL LOW (ref 12.0–15.0)
MCH: 31 pg (ref 26.0–34.0)
MCHC: 31.9 g/dL (ref 30.0–36.0)
MCV: 97.1 fL (ref 80.0–100.0)
Platelets: 199 10*3/uL (ref 150–400)
RBC: 3.84 MIL/uL — ABNORMAL LOW (ref 3.87–5.11)
RDW: 14.7 % (ref 11.5–15.5)
WBC: 6 10*3/uL (ref 4.0–10.5)
nRBC: 0 % (ref 0.0–0.2)

## 2023-08-15 LAB — COMPREHENSIVE METABOLIC PANEL
ALT: 26 U/L (ref 0–44)
AST: 32 U/L (ref 15–41)
Albumin: 3.9 g/dL (ref 3.5–5.0)
Alkaline Phosphatase: 67 U/L (ref 38–126)
Anion gap: 13 (ref 5–15)
BUN: 15 mg/dL (ref 8–23)
CO2: 22 mmol/L (ref 22–32)
Calcium: 9.9 mg/dL (ref 8.9–10.3)
Chloride: 106 mmol/L (ref 98–111)
Creatinine, Ser: 1.08 mg/dL — ABNORMAL HIGH (ref 0.44–1.00)
GFR, Estimated: 55 mL/min — ABNORMAL LOW (ref >60)
Glucose, Bld: 84 mg/dL (ref 70–99)
Potassium: 3.7 mmol/L (ref 3.5–5.1)
Sodium: 141 mmol/L (ref 135–145)
Total Bilirubin: 0.5 mg/dL (ref 0.0–1.2)
Total Protein: 7 g/dL (ref 6.5–8.1)

## 2023-08-15 LAB — DIFFERENTIAL
Abs Immature Granulocytes: 0.02 10*3/uL (ref 0.00–0.07)
Basophils Absolute: 0 10*3/uL (ref 0.0–0.1)
Basophils Relative: 1 %
Eosinophils Absolute: 0.1 10*3/uL (ref 0.0–0.5)
Eosinophils Relative: 1 %
Immature Granulocytes: 0 %
Lymphocytes Relative: 42 %
Lymphs Abs: 2.5 10*3/uL (ref 0.7–4.0)
Monocytes Absolute: 0.5 10*3/uL (ref 0.1–1.0)
Monocytes Relative: 8 %
Neutro Abs: 2.9 10*3/uL (ref 1.7–7.7)
Neutrophils Relative %: 48 %

## 2023-08-15 LAB — I-STAT CHEM 8, ED
BUN: 18 mg/dL (ref 8–23)
Calcium, Ion: 1.15 mmol/L (ref 1.15–1.40)
Chloride: 109 mmol/L (ref 98–111)
Creatinine, Ser: 1.1 mg/dL — ABNORMAL HIGH (ref 0.44–1.00)
Glucose, Bld: 80 mg/dL (ref 70–99)
HCT: 37 % (ref 36.0–46.0)
Hemoglobin: 12.6 g/dL (ref 12.0–15.0)
Potassium: 3.6 mmol/L (ref 3.5–5.1)
Sodium: 141 mmol/L (ref 135–145)
TCO2: 23 mmol/L (ref 22–32)

## 2023-08-15 LAB — APTT: aPTT: 28 s (ref 24–36)

## 2023-08-15 NOTE — Discharge Instructions (Signed)
You were seen in the emergency department for your vision changes.  Your workup showed no signs of stroke or obvious abnormality with your eye on MRI of your brain and your eyes.  It is unclear what is causing your symptoms at this time but should follow-up with your ophthalmologist to have further workup.  You should return to the emergency department if you are losing vision in your eye, you have severe pain in your eye, you develop numbness or weakness or if you have any other new or concerning symptoms.

## 2023-08-15 NOTE — ED Notes (Signed)
Pt transported to MRI

## 2023-08-15 NOTE — ED Provider Notes (Signed)
[place] EMERGENCY DEPARTMENT AT [facility] Provider Note   CSN: [id number] Arrival date & time: 08/15/23  1251     History  Chief Complaint  Patient presents with   Eye Problem    Suzanne Pearson is a 73 y.o. female.  Patient is a 73 year old female with a past medical history of hypertension presenting to the emergency department with left-sided visual abnormality.  The patient states that since Wednesday she felt like she is seen "spider webs" in her left vision.  She states it was initially on the medial aspect of her vision and is now more on the lateral aspect.  She denies any associated loss of vision, headaches, numbness or weakness.  She had a physical exam done by her primary doctor yesterday that was normal and saw her eye doctor, Dr. Dione Booze, today who found that she had "left homonymous defect concerning for right sided lesion and stroke" and recommended that she come to the emergency department for further stroke evaluation.  The history is provided by the patient.  Eye Problem      Home Medications Prior to Admission medications   Medication Sig Start Date End Date Taking? Authorizing Provider  Ascorbic Acid (VITAMIN C) 1000 MG tablet Take 2,000 mg by mouth 2 (two) times daily.     [provider]  BEE POLLEN PO Take 3 tablets by mouth daily.    [provider]  Cholecalciferol (VITAMIN D) 2000 units CAPS Take 8,000 Units by mouth daily.    [provider]  Cyanocobalamin (B-12) 1000 MCG CAPS Take 1,000 mcg by mouth daily.    [provider]  cyclobenzaprine (FLEXERIL) 10 MG tablet Take 1 tablet (10 mg total) 3 (three) times daily as needed by mouth for muscle spasms. 06/04/17   Tressie Stalker, MD  docusate sodium (COLACE) 100 MG capsule Take 1 capsule (100 mg total) 2 (two) times daily by mouth. 06/04/17   Tressie Stalker, MD  Magnesium Oxide 500 MG CAPS Take 1,000 mg by mouth daily.    [provider]   Omega-3 Fatty Acids (FISH OIL) 1000 MG CAPS Take 1,000 mg by mouth 3 (three) times daily.     [provider]  oxyCODONE 10 MG TABS Take 1 tablet (10 mg total) every 4 (four) hours as needed by mouth for severe pain ((score 7 to 10)). 06/04/17   Tressie Stalker, MD  Potassium (POTASSIMIN PO) Take 50 mg by mouth daily.    [provider]  Probiotic Product (ACIDOPHILUS/BIFIDUS PO) Take 3 tablets by mouth daily.    [provider]  valsartan-hydrochlorothiazide (DIOVAN-HCT) 160-12.5 MG per tablet Take 1 tablet by mouth daily.     [provider]      Allergies    Cranberry, Latex, Penicillins, Pineapple, Shrimp [shellfish allergy], Sulfa antibiotics, Tomato, and Iodinated contrast media    Review of Systems   Review of Systems  Physical Exam Updated Vital Signs BP 138/77 (BP Location: Left Arm)   Pulse 67   Temp 98.2 F (36.8 C) (Oral)   Resp 17   Ht 5\' 4"  (1.626 m)   Wt 90.7 kg   SpO2 100%   BMI 34.33 kg/m  Physical Exam Vitals and nursing note reviewed.  Constitutional:      General: She is not in acute distress.    Appearance: Normal appearance.  HENT:     Head: Normocephalic and atraumatic.     Nose: Nose normal.     Mouth/Throat:  Mouth: Mucous membranes are moist.     Pharynx: Oropharynx is clear.  Eyes:     Extraocular Movements: Extraocular movements intact.     Conjunctiva/sclera: Conjunctivae normal.     Pupils: Pupils are equal, round, and reactive to light.     Comments: No visual field defect  Cardiovascular:     Rate and Rhythm: Normal rate and regular rhythm.     Heart sounds: Normal heart sounds.  Pulmonary:     Effort: Pulmonary effort is normal.     Breath sounds: Normal breath sounds.  Abdominal:     General: Abdomen is flat.  Musculoskeletal:        General: Normal range of motion.     Cervical back: Normal range of motion.  Skin:    General: Skin is warm and dry.  Neurological:     General: No focal  deficit present.     Mental Status: She is alert and oriented to person, place, and time.     Cranial Nerves: No cranial nerve deficit.     Sensory: No sensory deficit.     Motor: No weakness.     Coordination: Coordination normal.  Psychiatric:        Mood and Affect: Mood normal.        Behavior: Behavior normal.     ED Results / Procedures / Treatments   Labs (all labs ordered are listed, but only abnormal results are displayed) Labs Reviewed  CBC - Abnormal; Notable for the following components:      Result Value   RBC 3.84 (*)    Hemoglobin 11.9 (*)    All other components within normal limits  COMPREHENSIVE METABOLIC PANEL - Abnormal; Notable for the following components:   Creatinine, Ser 1.08 (*)    GFR, Estimated 55 (*)    All other components within normal limits  I-STAT CHEM 8, ED - Abnormal; Notable for the following components:   Creatinine, Ser 1.10 (*)    All other components within normal limits  PROTIME-INR  APTT  DIFFERENTIAL    EKG None  Radiology MR Brain Wo Contrast (neuro protocol) Result Date: 08/15/2023 CLINICAL DATA:  Initial evaluation for acute neuro deficit, stroke suspected, l visual disturbance of left eye. EXAM: MRI HEAD AND ORBITS WITHOUT CONTRAST TECHNIQUE: Multiplanar, multi-echo pulse sequences of the brain and surrounding structures were acquired without intravenous contrast. Multiplanar, multi-echo pulse sequences of the orbits and surrounding structures were acquired including fat saturation techniques, without intravenous contrast administration. COMPARISON:  CT from earlier the same day. FINDINGS: MRI HEAD FINDINGS Brain: Cerebral volume within normal limits. Scattered patchy T2/FLAIR hyperintensity involving the periventricular deep white matter both cerebral hemispheres, most characteristic of chronic microvascular ischemic disease, mild for age. No evidence for acute or subacute infarct. Gray-white matter differentiation maintained. No  areas of chronic cortical infarction. No acute intracranial hemorrhage. Small focus of chronic hemosiderin staining noted at the right occipital lobe, suggesting prior hemorrhage at this location (series 7, image 58). No mass lesion, midline shift or mass effect no hydrocephalus or extra-axial fluid collection. Pituitary gland suprasellar region within normal limits. Vascular: Major intracranial vascular flow voids are maintained. Skull and upper cervical spine: Cranial junction within normal limits. Bone marrow signal intensity normal. No scalp soft tissue abnormality. Other: Trace bilateral mastoid effusions noted, of doubtful significance. MRI ORBITS FINDINGS Orbits: Globes symmetric in size with normal appearance and morphology bilaterally. Optic nerves symmetric and within normal limits. No abnormal optic nerve edema. No abnormality  about either optic nerve sheath complex. Orbital apices within normal limits. No abnormality about the cavernous sinus. Optic chiasm normally situated within the suprasellar cistern without abnormality. Extra-ocular muscles symmetric and normal. Intraconal and extraconal fat well-maintained. Lacrimal glands normal. Visualized sinuses: Mild mucosal thickening noted about the ethmoidal air cells. Visualized paranasal sinuses are otherwise clear. Soft tissues: Unremarkable. IMPRESSION: MRI HEAD: 1. No acute intracranial abnormality. 2. Mild chronic microvascular ischemic disease for age. 3. Small focus of chronic hemosiderin staining at the right occipital lobe, suggesting prior hemorrhage at this location. MRI ORBITS: Normal MRI appearance of the orbits. No acute orbital abnormality identified. Electronically Signed   By: Rise Mu M.D.   On: 08/15/2023 20:49   MR ORBITS WO CONTRAST Result Date: 08/15/2023 CLINICAL DATA:  Initial evaluation for acute neuro deficit, stroke suspected, l visual disturbance of left eye. EXAM: MRI HEAD AND ORBITS WITHOUT CONTRAST TECHNIQUE:  Multiplanar, multi-echo pulse sequences of the brain and surrounding structures were acquired without intravenous contrast. Multiplanar, multi-echo pulse sequences of the orbits and surrounding structures were acquired including fat saturation techniques, without intravenous contrast administration. COMPARISON:  CT from earlier the same day. FINDINGS: MRI HEAD FINDINGS Brain: Cerebral volume within normal limits. Scattered patchy T2/FLAIR hyperintensity involving the periventricular deep white matter both cerebral hemispheres, most characteristic of chronic microvascular ischemic disease, mild for age. No evidence for acute or subacute infarct. Gray-white matter differentiation maintained. No areas of chronic cortical infarction. No acute intracranial hemorrhage. Small focus of chronic hemosiderin staining noted at the right occipital lobe, suggesting prior hemorrhage at this location (series 7, image 58). No mass lesion, midline shift or mass effect no hydrocephalus or extra-axial fluid collection. Pituitary gland suprasellar region within normal limits. Vascular: Major intracranial vascular flow voids are maintained. Skull and upper cervical spine: Cranial junction within normal limits. Bone marrow signal intensity normal. No scalp soft tissue abnormality. Other: Trace bilateral mastoid effusions noted, of doubtful significance. MRI ORBITS FINDINGS Orbits: Globes symmetric in size with normal appearance and morphology bilaterally. Optic nerves symmetric and within normal limits. No abnormal optic nerve edema. No abnormality about either optic nerve sheath complex. Orbital apices within normal limits. No abnormality about the cavernous sinus. Optic chiasm normally situated within the suprasellar cistern without abnormality. Extra-ocular muscles symmetric and normal. Intraconal and extraconal fat well-maintained. Lacrimal glands normal. Visualized sinuses: Mild mucosal thickening noted about the ethmoidal air cells.  Visualized paranasal sinuses are otherwise clear. Soft tissues: Unremarkable. IMPRESSION: MRI HEAD: 1. No acute intracranial abnormality. 2. Mild chronic microvascular ischemic disease for age. 3. Small focus of chronic hemosiderin staining at the right occipital lobe, suggesting prior hemorrhage at this location. MRI ORBITS: Normal MRI appearance of the orbits. No acute orbital abnormality identified. Electronically Signed   By: Rise Mu M.D.   On: 08/15/2023 20:49   CT Head Wo Contrast Result Date: 08/15/2023 CLINICAL DATA:  Left visual change for 3 days with blown pupil EXAM: CT HEAD WITHOUT CONTRAST TECHNIQUE: Contiguous axial images were obtained from the base of the skull through the vertex without intravenous contrast. RADIATION DOSE REDUCTION: This exam was performed according to the departmental dose-optimization program which includes automated exposure control, adjustment of the mA and/or kV according to patient size and/or use of iterative reconstruction technique. COMPARISON:  10/22/2006 FINDINGS: Brain: No evidence of acute infarction, hemorrhage, mass, mass effect, or midline shift. No hydrocephalus or extra-axial fluid collection. Vascular: No hyperdense vessel. Skull: Negative for fracture or focal lesion. Sinuses/Orbits: No acute finding. Other:  The mastoid air cells are well aerated. IMPRESSION: No acute intracranial process. Electronically Signed   By: Wiliam Ke M.D.   On: 08/15/2023 14:29    Procedures Procedures    Medications Ordered in ED Medications - No data to display  ED Course/ Medical Decision Making/ A&P Clinical Course as of 08/15/23 2154  Fri Aug 15, 2023  2114 No acute abnormality on MRI.  [VK]    Clinical Course User Index [VK] Rexford Maus, DO                                 Medical Decision Making This patient presents to the ED with chief complaint(s) of L vision change with pertinent past medical history of HTN which further  complicates the presenting complaint. The complaint involves an extensive differential diagnosis and also carries with it a high risk of complications and morbidity.    The differential diagnosis includes CVA, TIA, CRAO/CRVO, retinal detachment, vitreous hemorrhage, ICH, mass effect  Additional history obtained: Additional history obtained from N/A Records reviewed Primary Care Documents and ophthalmology records  ED Course and Reassessment: On patient's arrival she is mildly hypertensive otherwise hemodynamically stable in no acute distress.  She was initially evaluated in triage and had labs and head CT performed.  Labs are within normal range and CT head showed no acute abnormality.  With patient's abnormal findings at the ophthalmology office concern for possible CVA and will have MRI of her brain and orbits performed.  No other neurologic deficits seen on exam.  Last known well was Wednesday so she is outside TNK window and will continue to be closely reassessed.  Independent labs interpretation:  The following labs were independently interpreted: Within normal range  Independent visualization of imaging: - I independently visualized the following imaging with scope of interpretation limited to determining acute life threatening conditions related to emergency care: CTH, MRI brain/orbits, which revealed no acute abnormality  Consultation: - Consulted or discussed management/test interpretation w/ external professional: N/A  Consideration for admission or further workup: Patient has no emergent conditions requiring admission or further work-up at this time and is stable for discharge home with primary care and ophtho follow-up  Social Determinants of health: N/A \  Amount and/or Complexity of Data Reviewed Radiology: ordered.          Final Clinical Impression(s) / ED Diagnoses Final diagnoses:  Vision changes    Rx / DC Orders ED Discharge Orders     None          Rexford Maus, DO 08/15/23 2154

## 2023-08-15 NOTE — ED Provider Triage Note (Signed)
Emergency Medicine Provider Triage Evaluation Note  Suzanne Pearson , a 73 y.o. female  was evaluated in triage.  Pt complains of seeing "spider webs" in the left field of vision.  Does not have any changes in her right eye.  States she saw her eye doctor, and they recommended she come here for for stroke evaluation.  This visual change has been ongoing for the past 3 days.  Denies any weakness, slurred speech, facial droop, any other symptoms.  Review of Systems  Positive: As above Negative: As above  Physical Exam  Ht 5\' 4"  (1.626 m)   Wt 90.7 kg   BMI 34.33 kg/m  Gen:   Awake, no distress   Resp:  Normal effort  MSK:   Moves extremities without difficulty  Other:  Left pupil blown and larger than right  Medical Decision Making  Medically screening exam initiated at 1:18 PM.  Appropriate orders placed.  Suzanne Pearson was informed that the remainder of the evaluation will be completed by another provider, this initial triage assessment does not replace that evaluation, and the importance of remaining in the ED until their evaluation is complete.     Suzanne Merles, PA-C 08/15/23 1320

## 2023-08-15 NOTE — ED Triage Notes (Signed)
Pt states she sees "spider webs" in left eye that started Wednesday. Pt states eye doctor sent pt over here. Denies stroke sx and denies double vision.

## 2023-08-15 NOTE — ED Notes (Signed)
ED Provider at bedside.

## 2023-08-19 DIAGNOSIS — M47816 Spondylosis without myelopathy or radiculopathy, lumbar region: Secondary | ICD-10-CM | POA: Diagnosis not present

## 2023-08-19 DIAGNOSIS — Z981 Arthrodesis status: Secondary | ICD-10-CM | POA: Diagnosis not present

## 2023-08-19 DIAGNOSIS — M961 Postlaminectomy syndrome, not elsewhere classified: Secondary | ICD-10-CM | POA: Diagnosis not present

## 2023-08-20 DIAGNOSIS — M25552 Pain in left hip: Secondary | ICD-10-CM | POA: Diagnosis not present

## 2023-08-25 ENCOUNTER — Ambulatory Visit (INDEPENDENT_AMBULATORY_CARE_PROVIDER_SITE_OTHER): Payer: Federal, State, Local not specified - PPO | Admitting: Audiology

## 2023-08-25 ENCOUNTER — Ambulatory Visit (INDEPENDENT_AMBULATORY_CARE_PROVIDER_SITE_OTHER): Payer: Medicare HMO | Admitting: Otolaryngology

## 2023-08-25 VITALS — BP 132/81 | HR 64 | Ht 64.0 in | Wt 196.0 lb

## 2023-08-25 DIAGNOSIS — Z8669 Personal history of other diseases of the nervous system and sense organs: Secondary | ICD-10-CM | POA: Diagnosis not present

## 2023-08-25 DIAGNOSIS — H903 Sensorineural hearing loss, bilateral: Secondary | ICD-10-CM

## 2023-08-25 DIAGNOSIS — J310 Chronic rhinitis: Secondary | ICD-10-CM | POA: Insufficient documentation

## 2023-08-25 DIAGNOSIS — Z09 Encounter for follow-up examination after completed treatment for conditions other than malignant neoplasm: Secondary | ICD-10-CM

## 2023-08-25 DIAGNOSIS — H6982 Other specified disorders of Eustachian tube, left ear: Secondary | ICD-10-CM | POA: Insufficient documentation

## 2023-08-25 MED ORDER — PREDNISONE 10 MG (21) PO TBPK: ORAL_TABLET | ORAL | 0 refills | Status: AC

## 2023-08-25 NOTE — Progress Notes (Signed)
  [address], [address] [place], [place] [address] [phone]  Audiological Evaluation    Name: Suzanne Pearson     DOB:   [birth year]      MRN:   [id number]                                                                                     Service Date: 08/25/2023     Accompanied by: unaccompanied    Patient comes today after Dr. Suszanne Conners, ENT sent a referral for a hearing evaluation due to concerns with hearing decline that was first noticed after she was sick November-December 2024.   Symptoms Yes Details  Hearing loss  [x]  Previous audiogram was completed at Dr.Teoh's and showed bilateral sensorineural hearing loss, slightly worse in the left ear.  Tinnitus  []    Aural fullness   [x]  Clogged sensation in both ears since she had a URI.  Balance problems  []    Noise exposure  []    Previous ear surgeries  []    Family history  [x]  Mother wears hearing aids.   Amplification  []    Other  []      Otoscopy: Right ear: Clear external ear canals and notable landmarks visualized on the tympanic membrane. Left ear:  Clear external ear canals and notable landmarks visualized on the tympanic membrane.  Tympanometry: Right ear: Type A- Normal external ear canal volume with normal middle ear pressure and tympanic membrane compliance Left ear: Type A- Normal external ear canal volume with normal middle ear pressure and tympanic membrane compliance  Pure tone Audiometry: Normal to moderate sensorineural hearing loss from [id number] Hz, in both ears.   Speech Audiometry: Right ear- Speech Reception Threshold (SRT) was obtained at 30 dBHL Left ear-Speech Reception Threshold (SRT) was obtained at 30 dBHL   Word Recognition Score Tested using NU-6 (MLV) Right ear: 88% was obtained at a presentation level of 75 dBHL with contralateral masking which is deemed as  good . Left ear: 96% was obtained at a presentation level of 75 dBHL with contralateral masking which is deemed as   excellent.  The hearing test results were completed under headphones and results are deemed to be of  good reliability. Test technique:  conventional    Impression: There was a significant decline in pure-tone thresholds today when compared to the previous audiogram on file, mainly observed in the right ear.   Recommendations: Follow up with ENT as scheduled for today. Repeat audiogram after medical care. Consider a communication needs assessment after medical clearance for hearing aids is obtained.   Alfred Eckley MARIE LEROUX-MARTINEZ, AUD, CCC-A

## 2023-08-25 NOTE — Progress Notes (Signed)
Patient ID: Suzanne Pearson, female   DOB: 11-23-1950, 73 y.o.   MRN: [id number]  New complaint: Left ear hearing loss  HPI: The patient is a 73 year old female who presents today complaining of left ear hearing loss for the past month.  Her symptoms started after she had an upper respiratory infection, resulting in nasal congestion and coughing spells.  She self treated with oral antibiotic, Mucinex, and OTC cough medication.  Her URI symptoms have resolved.  However, she has noted decreased left ear hearing.  The patient was previously seen for left eustachian tube dysfunction and bilateral sensorineural hearing loss, worse on the left side.  Her MRI scan was negative for retrocochlear lesion.  She was treated with Flonase nasal spray and Valsalva exercise.  Currently she denies any otalgia, otorrhea, or vertigo.  Exam: General: Communicates without difficulty, well nourished, no acute distress. Head: Normocephalic, no evidence injury, no tenderness, facial buttresses intact without stepoff. Face/sinus: No tenderness to palpation and percussion. Facial movement is normal and symmetric. Eyes: PERRL, EOMI. No scleral icterus, conjunctivae clear. Neuro: CN II exam reveals vision grossly intact.  No nystagmus at any point of gaze. Ears: Auricles well formed without lesions.  Ear canals are intact without mass or lesion.  No erythema or edema is appreciated.  The TMs are intact without fluid. Nose: External evaluation reveals normal support and skin without lesions.  Dorsum is intact.  Anterior rhinoscopy reveals congested mucosa over anterior aspect of inferior turbinates and intact septum.  No purulence noted. Oral:  Oral cavity and oropharynx are intact, symmetric, without erythema or edema.  Mucosa is moist without lesions. Neck: Full range of motion without pain.  There is no significant lymphadenopathy.  No masses palpable.  Thyroid bed within normal limits to palpation.  Parotid glands and  submandibular glands equal bilaterally without mass.  Trachea is midline. Neuro:  CN 2-12 grossly intact.   Her hearing test shows bilateral mild to moderate sensorineural hearing loss.  Assessment: 1.  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  The patient has noted subjective worsening of her left ear hearing. 2.  History of left ear eustachian tube dysfunction. 3.  Her ear canals, tympanic membranes, and middle ear spaces are all normal.  No middle ear effusion or infection is noted.  Plan: 1.  The physical exam findings and the hearing test results are reviewed with the patient. 2.  The patient is reassured that no middle ear effusion or infection is noted. 3.  Prednisone Dosepak for 6 days. 4.  Continue with Flonase nasal spray and Valsalva exercise. 5.  The patient will return for reevaluation in 2 weeks.

## 2023-08-29 DIAGNOSIS — H40013 Open angle with borderline findings, low risk, bilateral: Secondary | ICD-10-CM | POA: Diagnosis not present

## 2023-08-29 DIAGNOSIS — H43392 Other vitreous opacities, left eye: Secondary | ICD-10-CM | POA: Diagnosis not present

## 2023-08-29 DIAGNOSIS — H53462 Homonymous bilateral field defects, left side: Secondary | ICD-10-CM | POA: Diagnosis not present

## 2023-08-29 DIAGNOSIS — H0102A Squamous blepharitis right eye, upper and lower eyelids: Secondary | ICD-10-CM | POA: Diagnosis not present

## 2023-09-09 ENCOUNTER — Encounter (INDEPENDENT_AMBULATORY_CARE_PROVIDER_SITE_OTHER): Payer: Self-pay

## 2023-09-09 ENCOUNTER — Ambulatory Visit (INDEPENDENT_AMBULATORY_CARE_PROVIDER_SITE_OTHER): Payer: 59 | Admitting: Otolaryngology

## 2023-09-09 VITALS — BP 136/84 | HR 70 | Ht 64.0 in | Wt 193.0 lb

## 2023-09-09 DIAGNOSIS — H903 Sensorineural hearing loss, bilateral: Secondary | ICD-10-CM

## 2023-09-09 DIAGNOSIS — H6982 Other specified disorders of Eustachian tube, left ear: Secondary | ICD-10-CM

## 2023-09-09 DIAGNOSIS — Z1331 Encounter for screening for depression: Secondary | ICD-10-CM | POA: Diagnosis not present

## 2023-09-09 DIAGNOSIS — Z01419 Encounter for gynecological examination (general) (routine) without abnormal findings: Secondary | ICD-10-CM | POA: Diagnosis not present

## 2023-09-09 DIAGNOSIS — H6980 Other specified disorders of Eustachian tube, unspecified ear: Secondary | ICD-10-CM | POA: Diagnosis not present

## 2023-09-10 NOTE — Progress Notes (Signed)
 Patient ID: Suzanne Pearson, female   DOB: 17-Oct-1950, 73 y.o.   MRN: [id number]  Follow-up: Muffled hearing  HPI: The patient is a 73 year old female who returns today for her follow-up evaluation.  The patient was previously noted to have bilateral sensorineural hearing loss.  At her last visit 2 weeks ago, she noted worsening of her left ear hearing.  The ears were muffled after an upper respiratory infection.  Her hearing test showed bilateral mild to moderate sensorineural hearing loss.  She was treated with prednisone Dosepak, Flonase nasal spray, and Valsalva exercise.  The patient returns today complaining of persistent muffled sensation in her ears.  She is still having difficulty auto insufflating her middle ear spaces.  She denies any further change in her hearing.  Exam: General: Communicates without difficulty, well nourished, no acute distress. Head: Normocephalic, no evidence injury, no tenderness, facial buttresses intact without stepoff. Face/sinus: No tenderness to palpation and percussion. Facial movement is normal and symmetric. Eyes: PERRL, EOMI. No scleral icterus, conjunctivae clear. Neuro: CN II exam reveals vision grossly intact.  No nystagmus at any point of gaze. Ears: Auricles well formed without lesions.  Ear canals are intact without mass or lesion.  No erythema or edema is appreciated.  The TMs are intact without fluid. Nose: External evaluation reveals normal support and skin without lesions.  Dorsum is intact.  Anterior rhinoscopy reveals congested mucosa over anterior aspect of inferior turbinates and intact septum.  No purulence noted. Oral:  Oral cavity and oropharynx are intact, symmetric, without erythema or edema.  Mucosa is moist without lesions. Neck: Full range of motion without pain.  There is no significant lymphadenopathy.  No masses palpable.  Thyroid bed within normal limits to palpation.  Parotid glands and submandibular glands equal bilaterally without  mass.  Trachea is midline. Neuro:  CN 2-12 grossly intact.   Assessment: 1.  The patient's history is consistent with eustachian tube dysfunction. 2.  Her ear canals, tympanic membranes, and middle ear spaces are otherwise normal.  No middle ear effusion is noted. 3.  Subjectively stable bilateral mild to moderate sensorineural hearing loss.  Plan: 1.  The physical exam findings are reviewed with the patient. 2.  Continue with Flonase nasal spray 2 sprays each nostril daily. 3.  Valsalva exercise multiple times a day. 4.  The patient will return for reevaluation in 6 weeks.

## 2023-09-19 DIAGNOSIS — M1612 Unilateral primary osteoarthritis, left hip: Secondary | ICD-10-CM | POA: Diagnosis not present

## 2023-10-03 ENCOUNTER — Telehealth (INDEPENDENT_AMBULATORY_CARE_PROVIDER_SITE_OTHER): Payer: Self-pay

## 2023-10-03 NOTE — Telephone Encounter (Signed)
 Cassandra from [facility] LVM requesting notes from patient's visit with Dr. Suszanne Conners. Spoke to [place] and was informed that [employer] is requesting the notes due for  a r/x approval. informed her that a medical release form would need to be completed to release notes. Cassandra verbalized and understood.

## 2023-10-08 DIAGNOSIS — H40013 Open angle with borderline findings, low risk, bilateral: Secondary | ICD-10-CM | POA: Diagnosis not present

## 2023-10-08 DIAGNOSIS — H53462 Homonymous bilateral field defects, left side: Secondary | ICD-10-CM | POA: Diagnosis not present

## 2023-10-08 DIAGNOSIS — H2513 Age-related nuclear cataract, bilateral: Secondary | ICD-10-CM | POA: Diagnosis not present

## 2023-10-08 DIAGNOSIS — H43392 Other vitreous opacities, left eye: Secondary | ICD-10-CM | POA: Diagnosis not present

## 2023-10-22 ENCOUNTER — Ambulatory Visit (INDEPENDENT_AMBULATORY_CARE_PROVIDER_SITE_OTHER): Payer: 59 | Admitting: Otolaryngology

## 2023-10-22 VITALS — BP 108/69 | HR 73 | Ht 65.0 in | Wt 190.0 lb

## 2023-10-22 DIAGNOSIS — H6980 Other specified disorders of Eustachian tube, unspecified ear: Secondary | ICD-10-CM

## 2023-10-22 DIAGNOSIS — J310 Chronic rhinitis: Secondary | ICD-10-CM

## 2023-10-22 DIAGNOSIS — H903 Sensorineural hearing loss, bilateral: Secondary | ICD-10-CM

## 2023-10-22 DIAGNOSIS — H6982 Other specified disorders of Eustachian tube, left ear: Secondary | ICD-10-CM

## 2023-10-22 NOTE — Progress Notes (Signed)
 Patient ID: Suzanne Pearson, female   DOB: [birth year], 73 y.o.   MRN: [id number]  Follow-up: Eustachian tube dysfunction, hearing loss  HPI: The patient is a 73 year old female who returns today for her follow-up evaluation.  The patient was last seen in February 2025.  At that time, she was noted to have eustachian tube dysfunction and bilateral mild to moderate sensorineural hearing loss.  The patient was treated with Flonase and Valsalva exercise.  The patient returns today reporting improvement in her symptoms.  She is able to auto insufflate her middle ear spaces.  She denies any otalgia, otorrhea, or change in her hearing.  Exam: General: Communicates without difficulty, well nourished, no acute distress. Head: Normocephalic, no evidence injury, no tenderness, facial buttresses intact without stepoff. Face/sinus: No tenderness to palpation and percussion. Facial movement is normal and symmetric. Eyes: PERRL, EOMI. No scleral icterus, conjunctivae clear. Neuro: CN II exam reveals vision grossly intact.  No nystagmus at any point of gaze. Ears: Auricles well formed without lesions.  Ear canals are intact without mass or lesion.  No erythema or edema is appreciated.  The TMs are intact without fluid. Nose: External evaluation reveals normal support and skin without lesions.  Dorsum is intact.  Anterior rhinoscopy reveals congested mucosa over anterior aspect of inferior turbinates and intact septum.  No purulence noted. Oral:  Oral cavity and oropharynx are intact, symmetric, without erythema or edema.  Mucosa is moist without lesions. Neck: Full range of motion without pain.  There is no significant lymphadenopathy.  No masses palpable.  Thyroid bed within normal limits to palpation.  Parotid glands and submandibular glands equal bilaterally without mass.  Trachea is midline. Neuro:  CN 2-12 grossly intact.   Assessment: 1.  The patient's eustachian tube dysfunction has clinically improved. 2.   Her ear canals, tympanic membranes, and middle ear spaces are normal.  No middle ear effusion is noted. 3.  Subjectively stable bilateral sensorineural hearing loss.  Plan: 1.  The physical exam findings are reviewed with the patient. 2.  Continue the use of Flonase nasal spray as needed. 3.  The patient will return for reevaluation in 6 months.

## 2023-11-04 DIAGNOSIS — Z9689 Presence of other specified functional implants: Secondary | ICD-10-CM | POA: Diagnosis not present

## 2023-11-04 DIAGNOSIS — F1120 Opioid dependence, uncomplicated: Secondary | ICD-10-CM | POA: Diagnosis not present

## 2023-11-04 DIAGNOSIS — M47816 Spondylosis without myelopathy or radiculopathy, lumbar region: Secondary | ICD-10-CM | POA: Diagnosis not present

## 2023-11-04 DIAGNOSIS — Z981 Arthrodesis status: Secondary | ICD-10-CM | POA: Diagnosis not present

## 2023-12-22 DIAGNOSIS — Z01818 Encounter for other preprocedural examination: Secondary | ICD-10-CM | POA: Diagnosis not present

## 2023-12-22 DIAGNOSIS — G8929 Other chronic pain: Secondary | ICD-10-CM | POA: Diagnosis not present

## 2023-12-22 DIAGNOSIS — I10 Essential (primary) hypertension: Secondary | ICD-10-CM | POA: Diagnosis not present

## 2023-12-22 DIAGNOSIS — M169 Osteoarthritis of hip, unspecified: Secondary | ICD-10-CM | POA: Diagnosis not present

## 2024-01-02 ENCOUNTER — Other Ambulatory Visit: Payer: Self-pay | Admitting: Obstetrics

## 2024-01-02 DIAGNOSIS — Z1231 Encounter for screening mammogram for malignant neoplasm of breast: Secondary | ICD-10-CM

## 2024-01-14 DIAGNOSIS — M1612 Unilateral primary osteoarthritis, left hip: Secondary | ICD-10-CM | POA: Diagnosis not present

## 2024-01-14 DIAGNOSIS — M25752 Osteophyte, left hip: Secondary | ICD-10-CM | POA: Diagnosis not present

## 2024-01-29 ENCOUNTER — Ambulatory Visit (INDEPENDENT_AMBULATORY_CARE_PROVIDER_SITE_OTHER): Payer: Federal, State, Local not specified - PPO | Admitting: Otolaryngology

## 2024-02-05 DIAGNOSIS — M47816 Spondylosis without myelopathy or radiculopathy, lumbar region: Secondary | ICD-10-CM | POA: Diagnosis not present

## 2024-02-05 DIAGNOSIS — Z9689 Presence of other specified functional implants: Secondary | ICD-10-CM | POA: Diagnosis not present

## 2024-02-05 DIAGNOSIS — F1120 Opioid dependence, uncomplicated: Secondary | ICD-10-CM | POA: Diagnosis not present

## 2024-02-09 DIAGNOSIS — M25552 Pain in left hip: Secondary | ICD-10-CM | POA: Diagnosis not present

## 2024-02-09 DIAGNOSIS — M25652 Stiffness of left hip, not elsewhere classified: Secondary | ICD-10-CM | POA: Diagnosis not present

## 2024-02-09 DIAGNOSIS — M6281 Muscle weakness (generalized): Secondary | ICD-10-CM | POA: Diagnosis not present

## 2024-02-11 DIAGNOSIS — M25652 Stiffness of left hip, not elsewhere classified: Secondary | ICD-10-CM | POA: Diagnosis not present

## 2024-02-11 DIAGNOSIS — M6281 Muscle weakness (generalized): Secondary | ICD-10-CM | POA: Diagnosis not present

## 2024-02-11 DIAGNOSIS — M25552 Pain in left hip: Secondary | ICD-10-CM | POA: Diagnosis not present

## 2024-02-16 DIAGNOSIS — M6281 Muscle weakness (generalized): Secondary | ICD-10-CM | POA: Diagnosis not present

## 2024-02-16 DIAGNOSIS — M25652 Stiffness of left hip, not elsewhere classified: Secondary | ICD-10-CM | POA: Diagnosis not present

## 2024-02-16 DIAGNOSIS — M25552 Pain in left hip: Secondary | ICD-10-CM | POA: Diagnosis not present

## 2024-02-18 DIAGNOSIS — M25652 Stiffness of left hip, not elsewhere classified: Secondary | ICD-10-CM | POA: Diagnosis not present

## 2024-02-18 DIAGNOSIS — M25552 Pain in left hip: Secondary | ICD-10-CM | POA: Diagnosis not present

## 2024-02-18 DIAGNOSIS — M6281 Muscle weakness (generalized): Secondary | ICD-10-CM | POA: Diagnosis not present

## 2024-02-19 DIAGNOSIS — Z6837 Body mass index (BMI) 37.0-37.9, adult: Secondary | ICD-10-CM | POA: Diagnosis not present

## 2024-02-19 DIAGNOSIS — I10 Essential (primary) hypertension: Secondary | ICD-10-CM | POA: Diagnosis not present

## 2024-02-19 DIAGNOSIS — G8929 Other chronic pain: Secondary | ICD-10-CM | POA: Diagnosis not present

## 2024-02-19 DIAGNOSIS — R7303 Prediabetes: Secondary | ICD-10-CM | POA: Diagnosis not present

## 2024-02-19 DIAGNOSIS — Z853 Personal history of malignant neoplasm of breast: Secondary | ICD-10-CM | POA: Diagnosis not present

## 2024-02-19 DIAGNOSIS — E038 Other specified hypothyroidism: Secondary | ICD-10-CM | POA: Diagnosis not present

## 2024-02-23 DIAGNOSIS — M25652 Stiffness of left hip, not elsewhere classified: Secondary | ICD-10-CM | POA: Diagnosis not present

## 2024-02-23 DIAGNOSIS — M25552 Pain in left hip: Secondary | ICD-10-CM | POA: Diagnosis not present

## 2024-02-23 DIAGNOSIS — M6281 Muscle weakness (generalized): Secondary | ICD-10-CM | POA: Diagnosis not present

## 2024-02-24 DIAGNOSIS — Z5189 Encounter for other specified aftercare: Secondary | ICD-10-CM | POA: Diagnosis not present

## 2024-02-25 DIAGNOSIS — M25652 Stiffness of left hip, not elsewhere classified: Secondary | ICD-10-CM | POA: Diagnosis not present

## 2024-02-25 DIAGNOSIS — M25552 Pain in left hip: Secondary | ICD-10-CM | POA: Diagnosis not present

## 2024-02-25 DIAGNOSIS — M6281 Muscle weakness (generalized): Secondary | ICD-10-CM | POA: Diagnosis not present

## 2024-03-03 ENCOUNTER — Ambulatory Visit
Admission: RE | Admit: 2024-03-03 | Discharge: 2024-03-03 | Disposition: A | Discharge status: Home / Self Care | Source: Ambulatory Visit | Attending: Obstetrics | Admitting: Obstetrics

## 2024-03-03 DIAGNOSIS — Z1231 Encounter for screening mammogram for malignant neoplasm of breast: Secondary | ICD-10-CM | POA: Diagnosis not present

## 2024-03-03 DIAGNOSIS — M25552 Pain in left hip: Secondary | ICD-10-CM | POA: Diagnosis not present

## 2024-03-03 DIAGNOSIS — M25652 Stiffness of left hip, not elsewhere classified: Secondary | ICD-10-CM | POA: Diagnosis not present

## 2024-03-03 DIAGNOSIS — M6281 Muscle weakness (generalized): Secondary | ICD-10-CM | POA: Diagnosis not present

## 2024-03-05 DIAGNOSIS — M25652 Stiffness of left hip, not elsewhere classified: Secondary | ICD-10-CM | POA: Diagnosis not present

## 2024-03-05 DIAGNOSIS — M25552 Pain in left hip: Secondary | ICD-10-CM | POA: Diagnosis not present

## 2024-03-05 DIAGNOSIS — M6281 Muscle weakness (generalized): Secondary | ICD-10-CM | POA: Diagnosis not present

## 2024-03-08 DIAGNOSIS — M25652 Stiffness of left hip, not elsewhere classified: Secondary | ICD-10-CM | POA: Diagnosis not present

## 2024-03-08 DIAGNOSIS — M6281 Muscle weakness (generalized): Secondary | ICD-10-CM | POA: Diagnosis not present

## 2024-03-08 DIAGNOSIS — M25552 Pain in left hip: Secondary | ICD-10-CM | POA: Diagnosis not present

## 2024-03-10 DIAGNOSIS — M25652 Stiffness of left hip, not elsewhere classified: Secondary | ICD-10-CM | POA: Diagnosis not present

## 2024-03-10 DIAGNOSIS — M6281 Muscle weakness (generalized): Secondary | ICD-10-CM | POA: Diagnosis not present

## 2024-03-10 DIAGNOSIS — M25552 Pain in left hip: Secondary | ICD-10-CM | POA: Diagnosis not present

## 2024-04-27 DIAGNOSIS — M7062 Trochanteric bursitis, left hip: Secondary | ICD-10-CM | POA: Diagnosis not present

## 2024-05-12 DIAGNOSIS — F1120 Opioid dependence, uncomplicated: Secondary | ICD-10-CM | POA: Diagnosis not present

## 2024-05-12 DIAGNOSIS — M5416 Radiculopathy, lumbar region: Secondary | ICD-10-CM | POA: Diagnosis not present

## 2024-05-12 DIAGNOSIS — Z9689 Presence of other specified functional implants: Secondary | ICD-10-CM | POA: Diagnosis not present

## 2024-06-09 ENCOUNTER — Other Ambulatory Visit (HOSPITAL_COMMUNITY): Payer: Self-pay | Admitting: Student

## 2024-06-09 DIAGNOSIS — M5416 Radiculopathy, lumbar region: Secondary | ICD-10-CM

## 2024-06-28 ENCOUNTER — Ambulatory Visit (HOSPITAL_COMMUNITY): Admission: RE | Admit: 2024-06-28 | Discharge: 2024-06-28 | Attending: Student

## 2024-06-28 DIAGNOSIS — M5416 Radiculopathy, lumbar region: Secondary | ICD-10-CM | POA: Diagnosis not present

## 2024-07-20 DIAGNOSIS — M5416 Radiculopathy, lumbar region: Secondary | ICD-10-CM | POA: Diagnosis not present

## 2024-08-06 ENCOUNTER — Emergency Department (HOSPITAL_BASED_OUTPATIENT_CLINIC_OR_DEPARTMENT_OTHER)
Admission: EM | Admit: 2024-08-06 | Discharge: 2024-08-06 | Disposition: A | Discharge status: Home / Self Care | Attending: Emergency Medicine | Admitting: Emergency Medicine

## 2024-08-06 ENCOUNTER — Emergency Department (HOSPITAL_BASED_OUTPATIENT_CLINIC_OR_DEPARTMENT_OTHER)

## 2024-08-06 ENCOUNTER — Other Ambulatory Visit: Payer: Self-pay

## 2024-08-06 DIAGNOSIS — R10A2 Flank pain, left side: Secondary | ICD-10-CM | POA: Diagnosis not present

## 2024-08-06 DIAGNOSIS — Z91040 Latex allergy status: Secondary | ICD-10-CM | POA: Diagnosis not present

## 2024-08-06 DIAGNOSIS — R079 Chest pain, unspecified: Secondary | ICD-10-CM | POA: Diagnosis present

## 2024-08-06 DIAGNOSIS — R7989 Other specified abnormal findings of blood chemistry: Secondary | ICD-10-CM | POA: Insufficient documentation

## 2024-08-06 DIAGNOSIS — R0789 Other chest pain: Secondary | ICD-10-CM | POA: Insufficient documentation

## 2024-08-06 LAB — TROPONIN T, HIGH SENSITIVITY
Troponin T High Sensitivity: <15 ng/L (ref 0–19)
Troponin T High Sensitivity: <15 ng/L (ref 0–19)

## 2024-08-06 LAB — COMPREHENSIVE METABOLIC PANEL WITH GFR
ALT: 26 U/L (ref 0–44)
AST: 31 U/L (ref 15–41)
Albumin: 4.8 g/dL (ref 3.5–5.0)
Alkaline Phosphatase: 98 U/L (ref 38–126)
Anion gap: 13 (ref 5–15)
BUN: 17 mg/dL (ref 8–23)
CO2: 25 mmol/L (ref 22–32)
Calcium: 11.1 mg/dL — ABNORMAL HIGH (ref 8.9–10.3)
Chloride: 103 mmol/L (ref 98–111)
Creatinine, Ser: 0.93 mg/dL (ref 0.44–1.00)
GFR, Estimated: >60 mL/min
Glucose, Bld: 85 mg/dL (ref 70–99)
Potassium: 3.8 mmol/L (ref 3.5–5.1)
Sodium: 142 mmol/L (ref 135–145)
Total Bilirubin: 0.7 mg/dL (ref 0.0–1.2)
Total Protein: 8 g/dL (ref 6.5–8.1)

## 2024-08-06 LAB — CBC
HCT: 39.7 % (ref 36.0–46.0)
Hemoglobin: 13.1 g/dL (ref 12.0–15.0)
MCH: 30 pg (ref 26.0–34.0)
MCHC: 33 g/dL (ref 30.0–36.0)
MCV: 91.1 fL (ref 80.0–100.0)
Platelets: 199 K/uL (ref 150–400)
RBC: 4.36 MIL/uL (ref 3.87–5.11)
RDW: 15.7 % — ABNORMAL HIGH (ref 11.5–15.5)
WBC: 5.6 K/uL (ref 4.0–10.5)
nRBC: 0 % (ref 0.0–0.2)

## 2024-08-06 LAB — D-DIMER, QUANTITATIVE: D-Dimer, Quant: 1.21 ug{FEU}/mL — ABNORMAL HIGH (ref 0.00–0.50)

## 2024-08-06 MED ORDER — DIPHENHYDRAMINE HCL 25 MG PO CAPS
50.0000 mg | ORAL_CAPSULE | Freq: Once | ORAL | Status: AC
  Administered 2024-08-06: 50 mg via ORAL
  Filled 2024-08-06: qty 2

## 2024-08-06 MED ORDER — TIZANIDINE HCL 2 MG PO TABS: 4.0000 mg | ORAL_TABLET | Freq: Once | ORAL | Status: DC

## 2024-08-06 MED ORDER — IOHEXOL 350 MG/ML SOLN
75.0000 mL | Freq: Once | INTRAVENOUS | Status: AC | PRN
  Administered 2024-08-06: 75 mL via INTRAVENOUS

## 2024-08-06 MED ORDER — KETOROLAC TROMETHAMINE 15 MG/ML IJ SOLN
15.0000 mg | Freq: Once | INTRAMUSCULAR | Status: AC
  Administered 2024-08-06: 15 mg via INTRAVENOUS
  Filled 2024-08-06: qty 1

## 2024-08-06 MED ORDER — LIDOCAINE 5 % EX PTCH
1.0000 | MEDICATED_PATCH | Freq: Once | CUTANEOUS | Status: DC
  Administered 2024-08-06: 1 via TRANSDERMAL
  Filled 2024-08-06: qty 1

## 2024-08-06 MED ORDER — DIPHENHYDRAMINE HCL 50 MG/ML IJ SOLN
50.0000 mg | Freq: Once | INTRAMUSCULAR | Status: AC
  Administered 2024-08-06: 50 mg via INTRAVENOUS
  Filled 2024-08-06: qty 1

## 2024-08-06 MED ORDER — METHYLPREDNISOLONE SODIUM SUCC 40 MG IJ SOLR
40.0000 mg | Freq: Once | INTRAMUSCULAR | Status: AC
  Administered 2024-08-06: 40 mg via INTRAVENOUS
  Filled 2024-08-06: qty 1

## 2024-08-06 MED ORDER — DICLOFENAC SODIUM 75 MG PO TBEC: 75.0000 mg | DELAYED_RELEASE_TABLET | Freq: Two times a day (BID) | ORAL | 0 refills | Status: AC

## 2024-08-06 MED ORDER — DIPHENHYDRAMINE HCL 50 MG/ML IJ SOLN: 50.0000 mg | Freq: Once | INTRAMUSCULAR | Status: AC

## 2024-08-06 NOTE — Discharge Instructions (Signed)
 As discussed, the workup that was done today did not show any specific cause of your pain, however we are sure that there is no clot in your lungs, there is no issues with your heart, and there is no sign of any fractures or any other bony injuries.  Given this, we have prescribed you a course of the diclofenac , you can use this along with Tylenol  as needed for pain, follow-up with your primary care as scheduled on the 22nd.

## 2024-08-06 NOTE — ED Notes (Signed)
 7:03 PM  Report received from previous RN. This RN assumes care of the patient.

## 2024-08-06 NOTE — ED Triage Notes (Signed)
 C/o left sided flank pain. Seen at [facility] on Wednesday.  Given muscle relaxer's. No relief.

## 2024-08-06 NOTE — ED Provider Notes (Signed)
 " [place] EMERGENCY DEPARTMENT AT [facility] Provider Note   CSN: [id number] Arrival date & time: 08/06/24  1204     Patient presents with: Flank Pain   Suzanne Pearson is a 74 y.o. female who presents to the ED out of concern for left sided thoracic pain.  She states that originates in the left axilla and radiates down through the lower aspect of the ribs on the left side following the midaxillary line.  Does not radiate further into the chest, not followed with dyspnea, is exacerbated with lateral rotation of the chest as well as flexion and extension of the spine.  She also states that is worsened when she is standing upright and is relieved by resting position.  Previously assessed at urgent care and provided with a prescription of naproxen as well as methocarbamol for they diagnosed as a muscular strain.    Flank Pain Associated symptoms include chest pain. Pertinent negatives include no shortness of breath.       Prior to Admission medications  Medication Sig Start Date End Date Taking? Authorizing Provider  diclofenac  (VOLTAREN ) 75 MG EC tablet Take 1 tablet (75 mg total) by mouth 2 (two) times daily. 08/06/24  Yes Myriam Dorn BROCKS, PA  methocarbamol (ROBAXIN) 500 MG tablet Take 500 mg by mouth 3 (three) times daily. 08/04/24  Yes [provider]  naproxen (NAPROSYN) 500 MG tablet Take 500 mg by mouth every 12 (twelve) hours as needed. 08/04/24  Yes [provider]  Ascorbic Acid (VITAMIN C) 1000 MG tablet Take 2,000 mg by mouth 2 (two) times daily.     [provider]  BEE POLLEN PO Take 3 tablets by mouth daily.    [provider]  Cholecalciferol (VITAMIN D) 2000 units CAPS Take 8,000 Units by mouth daily.    [provider]  Cyanocobalamin (B-12) 1000 MCG CAPS Take 1,000 mcg by mouth daily.    [provider]  cyclobenzaprine  (FLEXERIL ) 10 MG tablet Take 1 tablet (10 mg total) 3 (three) times daily  as needed by mouth for muscle spasms. 06/04/17   Mavis Purchase, MD  docusate sodium  (COLACE) 100 MG capsule Take 1 capsule (100 mg total) 2 (two) times daily by mouth. 06/04/17   Mavis Purchase, MD  HYDROcodone -acetaminophen  (NORCO/VICODIN) 5-325 MG tablet Take 1 tablet by mouth 3 (three) times daily as needed.    [provider]  Magnesium  Oxide 500 MG CAPS Take 1,000 mg by mouth daily.    [provider]  Omega-3 Fatty Acids (FISH OIL) 1000 MG CAPS Take 1,000 mg by mouth 3 (three) times daily.     [provider]  oxyCODONE  10 MG TABS Take 1 tablet (10 mg total) every 4 (four) hours as needed by mouth for severe pain ((score 7 to 10)). 06/04/17   Mavis Purchase, MD  Potassium (POTASSIMIN PO) Take 50 mg by mouth daily.    [provider]  predniSONE  (STERAPRED UNI-PAK 21 TAB) 10 MG (21) TBPK tablet Per package instructions (6,5,4,3,2,1) 08/25/23   Karis Clunes, MD  Probiotic Product (ACIDOPHILUS/BIFIDUS PO) Take 3 tablets by mouth daily.    [provider]  valsartan -hydrochlorothiazide  (DIOVAN -HCT) 160-12.5 MG per tablet Take 1 tablet by mouth daily.     [provider]    Allergies: Cranberry, Latex, Penicillins, Pineapple, Shrimp [shellfish allergy], Sulfa antibiotics, Tomato, and Iodinated contrast media    Review of Systems  Respiratory:  Positive for chest tightness. Negative for shortness of breath.  Cardiovascular:  Positive for chest pain.  All other systems reviewed and are negative.   Updated Vital Signs BP (!) 152/82 (BP Location: Left Arm)   Pulse 78   Temp 98.6 F (37 C) (Oral)   Resp (!) 22   SpO2 100%   Physical Exam Vitals and nursing note reviewed.  Constitutional:      General: She is not in acute distress.    Appearance: Normal appearance.  HENT:     Head: Normocephalic and atraumatic.     Mouth/Throat:     Mouth: Mucous membranes are moist.     Pharynx: Oropharynx is clear.  Eyes:     Extraocular  Movements: Extraocular movements intact.     Conjunctiva/sclera: Conjunctivae normal.     Pupils: Pupils are equal, round, and reactive to light.  Cardiovascular:     Rate and Rhythm: Normal rate and regular rhythm.     Pulses: Normal pulses.          Radial pulses are 2+ on the right side and 2+ on the left side.     Heart sounds: Normal heart sounds, S1 normal and S2 normal. No murmur heard.    No friction rub. No gallop.  Pulmonary:     Effort: Pulmonary effort is normal.     Breath sounds: Normal breath sounds and air entry.  Chest:     Chest wall: Tenderness present.     Comments: Tenderness over the mid axillary line on the left lateral thorax originating from the axilla, down through the lower ribs on the left side.  No deformities or crepitus was appreciated.  Normal equal chest expansion is appreciated. Abdominal:     General: Abdomen is flat. Bowel sounds are normal.     Palpations: Abdomen is soft.  Musculoskeletal:        General: Normal range of motion.     Cervical back: Normal range of motion and neck supple.     Right lower leg: No edema.     Left lower leg: No edema.  Skin:    General: Skin is warm and dry.     Capillary Refill: Capillary refill takes less than 2 seconds.  Neurological:     General: No focal deficit present.     Mental Status: She is alert. Mental status is at baseline.  Psychiatric:        Mood and Affect: Mood normal.     (all labs ordered are listed, but only abnormal results are displayed) Labs Reviewed  CBC - Abnormal; Notable for the following components:      Result Value   RDW 15.7 (*)    All other components within normal limits  COMPREHENSIVE METABOLIC PANEL WITH GFR - Abnormal; Notable for the following components:   Calcium 11.1 (*)    All other components within normal limits  D-DIMER, QUANTITATIVE - Abnormal; Notable for the following components:   D-Dimer, Quant 1.21 (*)    All other components within normal limits   TROPONIN T, HIGH SENSITIVITY  TROPONIN T, HIGH SENSITIVITY    EKG: EKG Interpretation Date/Time:  Friday August 06 2024 13:31:43 EST Ventricular Rate:  67 PR Interval:  152 QRS Duration:  85 QT Interval:  412 QTC Calculation: 435 R Axis:   2  Text Interpretation: Sinus rhythm Abnormal R-wave progression, early transition Left ventricular hypertrophy Nonspecific T abnormalities, inferior leads Borderline ST elevation, lateral leads increased voltage Confirmed by Emil Share [phone]) on 08/06/2024 3:52:32 PM  Radiology: CT Angio  Chest PE W/Cm &/Or Wo Cm Result Date: 08/06/2024 EXAM: CTA CHEST 08/06/2024 08:21:04 PM TECHNIQUE: CTA of the chest was performed after the administration of 75 mL of iohexol  (OMNIPAQUE ) 350 MG/ML injection. Multiplanar reformatted images are provided for review. MIP images are provided for review. Automated exposure control, iterative reconstruction, and/or weight based adjustment of the mA/kV was utilized to reduce the radiation dose to as low as reasonably achievable. COMPARISON: Chest x-ray today. CLINICAL HISTORY: Pulmonary embolism (PE) suspected, low to intermediate prob, positive D-dimer. Left-sided chest pain. FINDINGS: PULMONARY ARTERIES: Pulmonary arteries are adequately opacified for evaluation. No acute pulmonary embolus. Main pulmonary artery is normal in caliber. MEDIASTINUM: The heart and pericardium demonstrate no acute abnormality. There is no acute abnormality of the thoracic aorta. LYMPH NODES: No mediastinal, hilar or axillary lymphadenopathy. LUNGS AND PLEURA: The lungs are without acute process. No focal consolidation or pulmonary edema. No evidence of pleural effusion or pneumothorax. UPPER ABDOMEN: Limited images of the upper abdomen are unremarkable. SOFT TISSUES AND BONES: No acute bone or soft tissue abnormality. IMPRESSION: 1. No pulmonary embolism. 2. No acute cardiopulmonary disease. Electronically signed by: Franky Crease MD 08/06/2024 08:26 PM  EST RP Workstation: [id number]   DG Chest Port 1 View Result Date: 08/06/2024 CLINICAL DATA:  Left-sided thoracic pain EXAM: PORTABLE CHEST 1 VIEW COMPARISON:  None Available. FINDINGS: The heart size and mediastinal contours are within normal limits. Both lungs are clear. Elevated left hemidiaphragm is noted. The visualized skeletal structures are unremarkable. IMPRESSION: No active disease. Electronically Signed   By: Lynwood Landy Raddle M.D.   On: 08/06/2024 13:44     Procedures   Medications Ordered in the ED  lidocaine  (LIDODERM ) 5 % 1 patch (1 patch Transdermal Patch Applied 08/06/24 1514)  ketorolac  (TORADOL ) 15 MG/ML injection 15 mg (15 mg Intravenous Given 08/06/24 1515)  diphenhydrAMINE  (BENADRYL ) injection 50 mg (50 mg Intravenous Given 08/06/24 1515)  methylPREDNISolone  sodium succinate (SOLU-MEDROL ) 40 mg/mL injection 40 mg (40 mg Intravenous Given 08/06/24 1602)  diphenhydrAMINE  (BENADRYL ) capsule 50 mg (50 mg Oral Given 08/06/24 1851)    Or  diphenhydrAMINE  (BENADRYL ) injection 50 mg ( Intravenous See Alternative 08/06/24 1851)  ketorolac  (TORADOL ) 15 MG/ML injection 15 mg (15 mg Intravenous Given 08/06/24 1954)  iohexol  (OMNIPAQUE ) 350 MG/ML injection 75 mL (75 mLs Intravenous Contrast Given 08/06/24 2020)                                    Medical Decision Making Amount and/or Complexity of Data Reviewed Labs: ordered. Radiology: ordered.  Risk Prescription drug management.   Medical Decision Making:   KAREY SUTHERS is a 74 y.o. female who presented to the ED today with left-sided chest pain detailed above.     Complete initial physical exam performed, notably the patient  was alert and oriented and in no apparent distress.  There is tenderness to the left lateral thorax, however no other pertinent findings of physical exam.    Reviewed and confirmed nursing documentation for past medical history, family history, social history.    Initial Assessment:   With the  patient's presentation of chest discomfort, consider possible muscular strain to the left lateral thorax, as well as potential ACS, pneumothorax, pulmonary embolus, rib fracture.   Initial Plan:  Obtain D-dimer to evaluate for risk of pulmonary embolus Screening labs including CBC and Metabolic panel to evaluate for infectious or metabolic etiology of disease.  Ketorolac  IV to manage pain CXR to evaluate for structural/infectious intrathoracic pathology.  EKG and serial troponin to evaluate for cardiac pathology. Objective evaluation as below reviewed   Initial Study Results:   Laboratory  All laboratory results reviewed without evidence of clinically relevant pathology.   Exceptions include: D-dimer elevated to 1.21  EKG EKG was reviewed independently. Rate, rhythm, axis, intervals all examined and without medically relevant abnormality. ST segments without concerns for elevations.    Radiology:  All images reviewed independently. Agree with radiology report at this time.   CT Angio Chest PE W/Cm &/Or Wo Cm Result Date: 08/06/2024 EXAM: CTA CHEST 08/06/2024 08:21:04 PM TECHNIQUE: CTA of the chest was performed after the administration of 75 mL of iohexol  (OMNIPAQUE ) 350 MG/ML injection. Multiplanar reformatted images are provided for review. MIP images are provided for review. Automated exposure control, iterative reconstruction, and/or weight based adjustment of the mA/kV was utilized to reduce the radiation dose to as low as reasonably achievable. COMPARISON: Chest x-ray today. CLINICAL HISTORY: Pulmonary embolism (PE) suspected, low to intermediate prob, positive D-dimer. Left-sided chest pain. FINDINGS: PULMONARY ARTERIES: Pulmonary arteries are adequately opacified for evaluation. No acute pulmonary embolus. Main pulmonary artery is normal in caliber. MEDIASTINUM: The heart and pericardium demonstrate no acute abnormality. There is no acute abnormality of the thoracic aorta. LYMPH NODES:  No mediastinal, hilar or axillary lymphadenopathy. LUNGS AND PLEURA: The lungs are without acute process. No focal consolidation or pulmonary edema. No evidence of pleural effusion or pneumothorax. UPPER ABDOMEN: Limited images of the upper abdomen are unremarkable. SOFT TISSUES AND BONES: No acute bone or soft tissue abnormality. IMPRESSION: 1. No pulmonary embolism. 2. No acute cardiopulmonary disease. Electronically signed by: Franky Crease MD 08/06/2024 08:26 PM EST RP Workstation: [id number]   DG Chest Port 1 View Result Date: 08/06/2024 CLINICAL DATA:  Left-sided thoracic pain EXAM: PORTABLE CHEST 1 VIEW COMPARISON:  None Available. FINDINGS: The heart size and mediastinal contours are within normal limits. Both lungs are clear. Elevated left hemidiaphragm is noted. The visualized skeletal structures are unremarkable. IMPRESSION: No active disease. Electronically Signed   By: Lynwood Landy Raddle M.D.   On: 08/06/2024 13:44      Reassessment and Plan:   There is an elevated D-dimer CTA of the chest was obtained to rule out potential pulmonary embolism.  She has had previous allergies to CT contrast therefore was provided with Solu-Medrol  and Benadryl  prior to CT and had a preimaging wait of 4 hours to allow for corticosteroids prior to imaging to prevent anaphylaxis.  This did not demonstrate any acute PE.  Troponin did not elevate over 2-hour.,  And remainder of the exam was unremarkable.  EKG is equally unremarkable.  She is given a Lidoderm  patch as well as another subsequent dose of ketorolac  which she says does help decrease her pain.  Given this development, find that this is likely due to muscular pain on the left lateral thorax, however there is no definitive mechanism for same.  Will manage with diclofenac  as well as acetaminophen  as needed, warm compresses to the area.  They understand and agree of no further concerns at this time, will provide with referral to her cardiologist as well as to  primary care for further workup and evaluation.       Final diagnoses:  Atypical chest pain    ED Discharge Orders          Ordered    diclofenac  (VOLTAREN ) 75 MG EC tablet  2 times  daily        08/06/24 2053               Myriam Dorn BROCKS, PA 08/06/24 2131    Rogelia Jerilynn RAMAN, MD 08/08/24 1348  "
